# Patient Record
Sex: Male | Born: 1950
Health system: Southern US, Community
[De-identification: ages and names within clinical notes are randomized; demographics above are authoritative.]

## PROBLEM LIST (undated history)

## (undated) DIAGNOSIS — I2510 Atherosclerotic heart disease of native coronary artery without angina pectoris: Secondary | ICD-10-CM

## (undated) DIAGNOSIS — E669 Obesity, unspecified: Secondary | ICD-10-CM

## (undated) DIAGNOSIS — H33319 Horseshoe tear of retina without detachment, unspecified eye: Secondary | ICD-10-CM

## (undated) DIAGNOSIS — R7989 Other specified abnormal findings of blood chemistry: Secondary | ICD-10-CM

## (undated) DIAGNOSIS — I10 Essential (primary) hypertension: Secondary | ICD-10-CM

## (undated) DIAGNOSIS — R945 Abnormal results of liver function studies: Secondary | ICD-10-CM

## (undated) DIAGNOSIS — G4730 Sleep apnea, unspecified: Secondary | ICD-10-CM

## (undated) DIAGNOSIS — E785 Hyperlipidemia, unspecified: Secondary | ICD-10-CM

## (undated) DIAGNOSIS — F439 Reaction to severe stress, unspecified: Secondary | ICD-10-CM

## (undated) HISTORY — DX: Essential (primary) hypertension: I10

## (undated) HISTORY — PX: APPENDECTOMY: SHX54

## (undated) HISTORY — DX: Abnormal results of liver function studies: R94.5

## (undated) HISTORY — PX: TONSILLECTOMY: SUR1361

## (undated) HISTORY — DX: Hyperlipidemia, unspecified: E78.5

## (undated) HISTORY — DX: Atherosclerotic heart disease of native coronary artery without angina pectoris: I25.10

## (undated) HISTORY — DX: Sleep apnea, unspecified: G47.30

## (undated) HISTORY — DX: Other specified abnormal findings of blood chemistry: R79.89

## (undated) HISTORY — DX: Reaction to severe stress, unspecified: F43.9

## (undated) HISTORY — DX: Obesity, unspecified: E66.9

## (undated) HISTORY — PX: INGUINAL HERNIA REPAIR: SUR1180

## (undated) HISTORY — PX: VASECTOMY: SHX75

## (undated) HISTORY — DX: Horseshoe tear of retina without detachment, unspecified eye: H33.319

---

## 2000-05-02 ENCOUNTER — Ambulatory Visit (HOSPITAL_BASED_OUTPATIENT_CLINIC_OR_DEPARTMENT_OTHER): Admission: RE | Admit: 2000-05-02 | Discharge: 2000-05-02 | Payer: Self-pay | Admitting: Family Medicine

## 2001-07-11 ENCOUNTER — Ambulatory Visit (HOSPITAL_COMMUNITY): Admission: RE | Admit: 2001-07-11 | Discharge: 2001-07-11 | Payer: Self-pay | Admitting: Otolaryngology

## 2001-07-11 ENCOUNTER — Encounter: Payer: Self-pay | Admitting: Otolaryngology

## 2002-11-04 ENCOUNTER — Ambulatory Visit (HOSPITAL_BASED_OUTPATIENT_CLINIC_OR_DEPARTMENT_OTHER): Admission: RE | Admit: 2002-11-04 | Discharge: 2002-11-04 | Payer: Self-pay | Admitting: Pulmonary Disease

## 2004-08-15 HISTORY — PX: CARDIAC CATHETERIZATION: SHX172

## 2005-04-15 ENCOUNTER — Ambulatory Visit (HOSPITAL_COMMUNITY): Admission: RE | Admit: 2005-04-15 | Discharge: 2005-04-15 | Payer: Self-pay

## 2005-06-20 ENCOUNTER — Ambulatory Visit (HOSPITAL_COMMUNITY): Admission: RE | Admit: 2005-06-20 | Discharge: 2005-06-20 | Payer: Self-pay | Admitting: Chiropractic Medicine

## 2010-02-23 ENCOUNTER — Encounter: Admission: RE | Admit: 2010-02-23 | Discharge: 2010-02-23 | Payer: Self-pay | Admitting: Cardiology

## 2010-03-22 ENCOUNTER — Ambulatory Visit: Payer: Self-pay | Admitting: Cardiology

## 2010-10-04 ENCOUNTER — Ambulatory Visit (INDEPENDENT_AMBULATORY_CARE_PROVIDER_SITE_OTHER): Payer: BC Managed Care – PPO | Admitting: Cardiology

## 2010-10-04 DIAGNOSIS — E785 Hyperlipidemia, unspecified: Secondary | ICD-10-CM

## 2010-10-04 DIAGNOSIS — I10 Essential (primary) hypertension: Secondary | ICD-10-CM

## 2010-11-29 ENCOUNTER — Encounter: Payer: Self-pay | Admitting: Cardiology

## 2010-11-29 DIAGNOSIS — H33319 Horseshoe tear of retina without detachment, unspecified eye: Secondary | ICD-10-CM | POA: Insufficient documentation

## 2010-11-29 DIAGNOSIS — I2510 Atherosclerotic heart disease of native coronary artery without angina pectoris: Secondary | ICD-10-CM | POA: Insufficient documentation

## 2010-11-29 DIAGNOSIS — G4730 Sleep apnea, unspecified: Secondary | ICD-10-CM | POA: Insufficient documentation

## 2010-11-29 DIAGNOSIS — I10 Essential (primary) hypertension: Secondary | ICD-10-CM | POA: Insufficient documentation

## 2010-11-29 DIAGNOSIS — E669 Obesity, unspecified: Secondary | ICD-10-CM | POA: Insufficient documentation

## 2010-11-29 DIAGNOSIS — H409 Unspecified glaucoma: Secondary | ICD-10-CM | POA: Insufficient documentation

## 2010-11-29 DIAGNOSIS — E785 Hyperlipidemia, unspecified: Secondary | ICD-10-CM | POA: Insufficient documentation

## 2010-11-29 DIAGNOSIS — R7989 Other specified abnormal findings of blood chemistry: Secondary | ICD-10-CM | POA: Insufficient documentation

## 2010-11-29 DIAGNOSIS — F439 Reaction to severe stress, unspecified: Secondary | ICD-10-CM | POA: Insufficient documentation

## 2010-11-30 ENCOUNTER — Ambulatory Visit (INDEPENDENT_AMBULATORY_CARE_PROVIDER_SITE_OTHER): Payer: BC Managed Care – PPO | Admitting: Cardiology

## 2010-11-30 ENCOUNTER — Encounter: Payer: Self-pay | Admitting: Cardiology

## 2010-11-30 VITALS — BP 128/74 | HR 66 | Ht 70.5 in | Wt 230.0 lb

## 2010-11-30 DIAGNOSIS — I10 Essential (primary) hypertension: Secondary | ICD-10-CM

## 2010-11-30 DIAGNOSIS — Z13220 Encounter for screening for lipoid disorders: Secondary | ICD-10-CM

## 2010-11-30 DIAGNOSIS — E7800 Pure hypercholesterolemia, unspecified: Secondary | ICD-10-CM

## 2010-11-30 DIAGNOSIS — E785 Hyperlipidemia, unspecified: Secondary | ICD-10-CM

## 2010-11-30 DIAGNOSIS — I2510 Atherosclerotic heart disease of native coronary artery without angina pectoris: Secondary | ICD-10-CM

## 2010-11-30 LAB — BASIC METABOLIC PANEL
BUN: 20 mg/dL (ref 6–23)
CO2: 28 mEq/L (ref 19–32)
Calcium: 9.3 mg/dL (ref 8.4–10.5)
Chloride: 102 mEq/L (ref 96–112)
Creatinine, Ser: 0.8 mg/dL (ref 0.4–1.5)
Glucose, Bld: 100 mg/dL — ABNORMAL HIGH (ref 70–99)

## 2010-11-30 LAB — HEPATIC FUNCTION PANEL
ALT: 99 U/L — ABNORMAL HIGH (ref 0–53)
AST: 55 U/L — ABNORMAL HIGH (ref 0–37)
Bilirubin, Direct: 0.1 mg/dL (ref 0.0–0.3)
Total Bilirubin: 0.5 mg/dL (ref 0.3–1.2)

## 2010-11-30 LAB — LIPID PANEL: Total CHOL/HDL Ratio: 5

## 2010-11-30 NOTE — Progress Notes (Signed)
Subjective:   [name] comes in today for followup visit. He was started on amlodipine and has done well. Blood pressure has been well-controlled. He has no lower extremity edema. Lab work will be reviewed today. I encouraged him to lose weight and modify habits. He does have a good exercise habit on a rowing machine.  Current Outpatient Prescriptions  Medication Sig Dispense Refill  . amLODipine (NORVASC) 5 MG tablet Take 1 tablet by mouth daily.      Marland Kitchen aspirin 81 MG tablet Take 81 mg by mouth daily.        . fish oil-omega-3 fatty acids 1000 MG capsule Take by mouth daily.        Marland Kitchen FLUoxetine (PROZAC) 20 MG capsule Take 20 mg by mouth daily.        . metFORMIN (GLUCOPHAGE) 1000 MG tablet Take 1,000 mg by mouth 2 (two) times daily with a meal.        . Multiple Vitamin (MULTIVITAMIN) tablet Take 1 tablet by mouth daily.        . rosuvastatin (CRESTOR) 5 MG tablet Take 5 mg by mouth 3 (three) times a week.        . valsartan-hydrochlorothiazide (DIOVAN-HCT) 320-25 MG per tablet Take 1 tablet by mouth daily.          No Known Allergies  Patient Active Problem List  Diagnoses  . Coronary artery disease  . Obesity  . Stress  . Hyperlipidemia  . Hypertension  . Sleep apnea  . Glaucoma  . Tear of retina  . Diabetes mellitus  . LFT elevation    History  Smoking status  . Never Smoker   Smokeless tobacco  . Never Used    History  Alcohol Use No    Family History  Problem Relation Age of Onset  . Heart failure Father   . Alcohol abuse Brother     Review of Systems:   The patient denies any heat or cold intolerance.  No weight gain or weight loss.  The patient denies headaches or blurry vision.  There is no cough or sputum production.  The patient denies dizziness.  There is no hematuria or hematochezia.  The patient denies any muscle aches or arthritis.  The patient denies any rash.  The patient denies frequent falling or instability.  There is no history of depression or anxiety.   All other systems were reviewed and are negative.   Physical Exam:   Vital signs are reviewed.The head is normocephalic and atraumatic.  Pupils are equally round and reactive to light.  Sclerae nonicteric.  Conjunctiva is clear.  Oropharynx is unremarkable.  There's adequate oral airway.  Neck is supple there are no masses.  Thyroid is not enlarged.  There is no lymphadenopathy.  Lungs are clear.  Chest is symmetric.  Heart shows a regular rate and rhythm.  S1 and S2 are normal.  There is no murmur click or gallop.  Abdomen is soft normal bowel sounds.  There is no organomegaly.  Genital and rectal deferred.  Extremities are without edema.  Peripheral pulses are adequate.  Neurologically intact.  Full range of motion.  The patient is not depressed.  Skin is warm and dry.  Assessment / Plan:

## 2010-11-30 NOTE — Assessment & Plan Note (Signed)
Blood pressure is well-controlled with the addition of amlodipine 5 mg per day.  Prescriptions are written.

## 2010-11-30 NOTE — Assessment & Plan Note (Signed)
He has mild coronary atherosclerosis which currently is asymptomatic. I will have him see Dr. Jens Som in one year to continue management review of his hypertension, hyperlipemia, and multiple cardiovascular risk factors. He did have cardiac catheterization which showed mild coronary atherosclerosis. He has somewhat diffuse 20-30% lesions throughout his coronary tree.

## 2010-11-30 NOTE — Assessment & Plan Note (Signed)
We'll continue Crestor 5 mg 3 times a week as well as his omega-3 fatty acids. He does have a problem with mild elevation of LFTs and lipids will be checked. He has a documented fatty liver and I encouraged him to lose weight again.

## 2010-12-02 ENCOUNTER — Telehealth: Payer: Self-pay | Admitting: *Deleted

## 2010-12-02 NOTE — Telephone Encounter (Signed)
Pt notified of lab results and was advised to loss weight, exercise regularly, and watch diet.  Pt told to have labs again in six months.

## 2010-12-02 NOTE — Telephone Encounter (Signed)
Message copied by Barnetta Hammersmith on Thu Dec 02, 2010  8:16 AM ------      Message from: Roger Shelter      Created: Wed Dec 01, 2010  1:58 PM       Work on weight loss, diet, and exercise.

## 2010-12-31 NOTE — H&P (Signed)
NAME:  JAHRED, TATAR               ACCOUNT NO.:  [id number]   MEDICAL RECORD NO.:  [id number]          PATIENT TYPE:  OIB   LOCATION:                               FACILITY:  [facility]   PHYSICIAN:  Colleen Can. Deborah Chalk, M.D.DATE OF BIRTH:  04/29/1951   DATE OF ADMISSION:  04/15/2005  DATE OF DISCHARGE:                                HISTORY & PHYSICAL   CHIEF COMPLAINT:  Previous episode of chest pain.   HISTORY OF PRESENT ILLNESS:  Mr. Voiles is a very pleasant 60 year old white  male.  He has had a longstanding history of hypertension, as well as sleep  apnea.  He presented to our office for a consultation on April 08, 2005.  He said that on the Saturday prior to being seen, he was mowing his yard in  the extreme heat and became exhausted and felt a significant loss of energy.  While it was definitely hot that day, it certainly was a very atypical  symptom for him to experience.  He also had a pulling-like sensation in the  left lower chest.  He was subsequently fatigued and wiped out the next day.  He went to an emergency [facility] where he had an electrocardiogram  performed, which was unremarkable.  He has been referred for a stress  Cardiolite study which was performed on April 11, 2005.  He exercised on  the standard Bruce protocol for a total of 12 minutes.  He demonstrated  excellent exercise tolerance with an adequate blood pressure response.  Clinically he had no complaints of chest pain.  His electrocardiogram was  negative; however, he did have some equivocal ST up-sloping.  His Cardiolite  images showed inferior basal ischemia.  His ejection fraction was 59%.  He  is now referred for an elective cardiac catheterization.  He has had no  recurrent episodes.   PAST MEDICAL HISTORY:  1.  Longstanding hypertension.  2.  Hyperlipidemia.  3.  History of sleep apnea.  4.  Glaucoma.  5.  Previous retinal tear.   ALLERGIES:  PENICILLIN AND QUESTIONABLY XYLOCAINE.   CURRENT MEDICATIONS:  1.  Trusopt eye drops.  2.  Xalatan eye drops.  3.  Diovan/hydrochlorothiazide 160/12.5 mg daily.  4.  Baby aspirin daily.   FAMILY HISTORY:  Father died at age 58 with heart failure.  Mother died at  age 16 with scleroderma.   SOCIAL HISTORY:  He works as a Production designer, theatre/television/film for [employer], broadcasting for [employer].  He has no tobacco use and only engages in social alcohol use.  He lives at home with his wife and daughter.   REVIEW OF SYSTEMS:  Basically as noted above and is otherwise unremarkable.   PHYSICAL EXAMINATION:  GENERAL:  He is a pleasant middle-aged male, in no  acute distress.  VITAL SIGNS:  Blood pressure 140/80 sitting and standing.  Weight 217  pounds.  Heart rate 76 and regular, respirations 18.  He is afebrile.  SKIN:  Warm and dry.  Color is unremarkable.  LUNGS:  Basically clear.  HEART:  A regular rhythm.  ABDOMEN:  Soft, positive  bowel sounds, nontender.  EXTREMITIES:  Without edema.  NEUROLOGIC:  Intact.  There are no gross focal deficits.   LABORATORY DATA:  TSH normal at 2.063.  Chemistries are normal.  BUN 20,  creatinine 1.1.  Cholesterol level showed a total cholesterol of 227,  triglycerides 289, HDL 32, LDL 138.  CBC is normal.  PT and PTT  unremarkable.   Electrocardiogram shows a sinus rhythm.   OVERALL IMPRESSION:  1.  Previous episode of chest pain in the setting of extreme fatigue.  2.  Abnormal stress Cardiolite.  3.  Longstanding hypertension.  4.  Hyperlipidemia, currently untreated.  5.  History of sleep apnea.   PLAN:  Will proceed on with an elective cardiac catheterization.  The  procedure has been reviewed in full detail, including the risks and  benefits, and he is willing to proceed on Friday, April 15, 2005.      Sharlee Blew, N.P.      Colleen Can. Deborah Chalk, M.D.  Electronically Signed    LC/MEDQ  D:  04/13/2005  T:  04/13/2005  Job:  [id number]

## 2010-12-31 NOTE — Cardiovascular Report (Signed)
[name], [name]               ACCOUNT NO.:  [id number]   MEDICAL RECORD NO.:  [id number]          PATIENT TYPE:  OIB   LOCATION:  2899                         FACILITY:  [facility]   PHYSICIAN:  Colleen Can. Deborah Chalk, M.D.DATE OF BIRTH:  04-06-1951   DATE OF PROCEDURE:  04/15/2005  DATE OF DISCHARGE:                              CARDIAC CATHETERIZATION   PROCEDURE:  Left heart catheterization with selective coronary angiography  and left ventricular angiography.   TYPE/SITE OF ENTRY:  Percutaneous right femoral artery with Angio-Seal.   CATHETERS:  6-French __________ curved Judkins right and left coronary  catheter; 6-French pigtail ventricular catheter.   CONTRAST MATERIAL:  Omnipaque.   MEDICATIONS GIVEN PRIOR TO PROCEDURE:  Valium 10 mg p.o.   MEDICATIONS GIVEN DURING PROCEDURE:  Versed 7 mg IV.   MEDICATIONS GIVEN AFTER PROCEDURE:  Vancomycin 1 gm IV.   HEMODYNAMIC DATA:  The aortic pressure was 121/67, LV 121/-11. There was no  aortic valve gradient noted on pullback.   ANGIOGRAPHIC DATA:  1.  The left main coronary artery is normal.  2.  The left circumflex continued as a large obtuse marginal. There was 20%      to 30% narrowing in the obtuse marginal, but no obstructive coronary      atherosclerosis.  3.  The left anterior descending bifurcated essentially in the mid portion.      The left anterior descending proper ended prior to the apex. The large      diagonal vessel had a 20% to 30% narrowing in its origin. There are      scattered irregularities, otherwise, that are nonobstructive.  4.  The right coronary artery is a large dominant vessel. There are      irregularities throughout the course of the vessel, but no obstructive      disease is noted.   Left ventriculogram is performed in the RAO position. Overall cardiac size  is normal. The global ejection fraction is 60% and regional wall motion is  normal.   OVERALL IMPRESSION:  1.  Normal left ventricular  function.  2.  Mild nonobstructive three-vessel coronary atherosclerosis.   PLAN:  I will manage blood pressure, lipids, and encourage diet, exercise,  and other modification cardiovascular risk factors.      Colleen Can. Deborah Chalk, M.D.  Electronically Signed     SNT/MEDQ  D:  04/15/2005  T:  04/15/2005  Job:  [id number]

## 2011-09-05 ENCOUNTER — Other Ambulatory Visit: Payer: Self-pay

## 2011-09-05 MED ORDER — AMLODIPINE BESYLATE 5 MG PO TABS: 5.0000 mg | ORAL_TABLET | Freq: Every day | ORAL | Status: AC

## 2011-09-05 NOTE — Telephone Encounter (Signed)
..   Requested Prescriptions   Signed Prescriptions Disp Refills  . amLODipine (NORVASC) 5 MG tablet 90 tablet 3    Sig: Take 1 tablet (5 mg total) by mouth daily.    Authorizing Provider: Lewayne Bunting    Ordering User: Christella Hartigan, Rama Sorci Judie Petit

## 2012-01-10 ENCOUNTER — Ambulatory Visit (INDEPENDENT_AMBULATORY_CARE_PROVIDER_SITE_OTHER): Payer: BC Managed Care – PPO | Admitting: Cardiology

## 2012-01-10 ENCOUNTER — Encounter: Payer: Self-pay | Admitting: Cardiology

## 2012-01-10 VITALS — BP 118/70 | HR 61 | Ht 70.0 in | Wt 218.0 lb

## 2012-01-10 DIAGNOSIS — I10 Essential (primary) hypertension: Secondary | ICD-10-CM

## 2012-01-10 DIAGNOSIS — E785 Hyperlipidemia, unspecified: Secondary | ICD-10-CM

## 2012-01-10 DIAGNOSIS — I2510 Atherosclerotic heart disease of native coronary artery without angina pectoris: Secondary | ICD-10-CM

## 2012-01-10 NOTE — Assessment & Plan Note (Signed)
Blood pressure controlled. Continue present medications. Potassium and renal function monitored by primary care. 

## 2012-01-10 NOTE — Assessment & Plan Note (Signed)
Continue aspirin and statin. Continue risk factor modification. 

## 2012-01-10 NOTE — Progress Notes (Signed)
   HPI: 61 year old male previously followed by Dr. Deborah Chalk for followup of coronary artery disease. Cardiac catheterization in September of 2006 showed a normal left main, a 20-30% OM, a 20-30% large diagonal and no other obstructive disease. Ejection fraction was 60%. Echocardiogram in September of 2006 revealed normal LV function and mild left ventricular hypertrophy. Nuclear study in February of 2011 revealed normal perfusion and an ejection fraction of 63%. Patient was last seen in April of 2012. Since then, the patient denies any dyspnea on exertion, orthopnea, PND, pedal edema, palpitations, syncope or chest pain.   Current Outpatient Prescriptions  Medication Sig Dispense Refill  . amLODipine (NORVASC) 5 MG tablet Take 1 tablet (5 mg total) by mouth daily.  90 tablet  3  . aspirin 81 MG tablet Take 81 mg by mouth daily.        . fish oil-omega-3 fatty acids 1000 MG capsule Take by mouth daily.        Marland Kitchen FLUoxetine (PROZAC) 20 MG capsule Take 20 mg by mouth daily.        . metFORMIN (GLUCOPHAGE) 1000 MG tablet Take 1,000 mg by mouth 2 (two) times daily with a meal.        . Multiple Vitamin (MULTIVITAMIN) tablet Take 1 tablet by mouth daily.        . rosuvastatin (CRESTOR) 5 MG tablet Take 5 mg by mouth 3 (three) times a week.        . valsartan-hydrochlorothiazide (DIOVAN-HCT) 320-25 MG per tablet Take 1 tablet by mouth daily.           Past Medical History  Diagnosis Date  . Coronary artery disease     MILD  . Obesity   . Stress   . Hyperlipidemia   . Hypertension   . Sleep apnea   . Glaucoma   . Tear of retina   . Diabetes mellitus   . LFT elevation     Past Surgical History  Procedure Date  . Cardiac catheterization 2006  . Appendectomy   . Tonsillectomy   . Vasectomy   . Inguinal hernia repair     RIGHT    History   Social History  . Marital Status: Single    Spouse Name: N/A    Number of Children: N/A  . Years of Education: N/A   Occupational History  .  Not on file.   Social History Main Topics  . Smoking status: Never Smoker   . Smokeless tobacco: Never Used  . Alcohol Use: No  . Drug Use: No  . Sexually Active: Not on file   Other Topics Concern  . Not on file   Social History Narrative  . No narrative on file    ROS: no fevers or chills, productive cough, hemoptysis, dysphasia, odynophagia, melena, hematochezia, dysuria, hematuria, rash, seizure activity, orthopnea, PND, pedal edema, claudication. Remaining systems are negative.  Physical Exam: Well-developed well-nourished in no acute distress.  Skin is warm and dry. Poison ivy over arms HEENT is normal.  Neck is supple.  Chest is clear to auscultation with normal expansion.  Cardiovascular exam is regular rate and rhythm.  Abdominal exam nontender or distended. No masses palpated. Extremities show no edema. neuro grossly intact  ECG sinus rhythm at a rate of 61. No ST changes.

## 2012-01-10 NOTE — Assessment & Plan Note (Addendum)
Continue statin. Lipids and liver monitored by primary care. 

## 2012-01-10 NOTE — Patient Instructions (Signed)
Your physician wants you to follow-up in: ONE YEAR WITH DR CRENSHAW You will receive a reminder letter in the mail two months in advance. If you don't receive a letter, please call our office to schedule the follow-up appointment.  

## 2012-02-13 ENCOUNTER — Other Ambulatory Visit: Payer: Self-pay | Admitting: Family Medicine

## 2012-02-13 DIAGNOSIS — R7989 Other specified abnormal findings of blood chemistry: Secondary | ICD-10-CM

## 2012-03-02 ENCOUNTER — Ambulatory Visit
Admission: RE | Admit: 2012-03-02 | Discharge: 2012-03-02 | Disposition: A | Discharge status: Home / Self Care | Payer: BC Managed Care – PPO | Source: Ambulatory Visit | Attending: Family Medicine | Admitting: Family Medicine

## 2012-03-02 DIAGNOSIS — R7989 Other specified abnormal findings of blood chemistry: Secondary | ICD-10-CM

## 2013-01-25 ENCOUNTER — Encounter: Payer: Self-pay | Admitting: Cardiology

## 2017-08-21 ENCOUNTER — Ambulatory Visit (INDEPENDENT_AMBULATORY_CARE_PROVIDER_SITE_OTHER): Payer: BLUE CROSS/BLUE SHIELD | Admitting: Otolaryngology

## 2017-08-21 DIAGNOSIS — H95122 Granulation of postmastoidectomy cavity, left ear: Secondary | ICD-10-CM

## 2017-09-11 ENCOUNTER — Telehealth: Payer: Self-pay

## 2017-09-11 NOTE — Telephone Encounter (Signed)
Referral sent to scheduling. 

## 2017-09-28 ENCOUNTER — Other Ambulatory Visit: Payer: Self-pay | Admitting: Family Medicine

## 2017-09-28 DIAGNOSIS — R4689 Other symptoms and signs involving appearance and behavior: Secondary | ICD-10-CM

## 2017-10-06 ENCOUNTER — Ambulatory Visit
Admission: RE | Admit: 2017-10-06 | Discharge: 2017-10-06 | Disposition: A | Discharge status: Home / Self Care | Payer: BLUE CROSS/BLUE SHIELD | Source: Ambulatory Visit | Attending: Family Medicine | Admitting: Family Medicine

## 2017-10-06 DIAGNOSIS — R4689 Other symptoms and signs involving appearance and behavior: Secondary | ICD-10-CM

## 2017-10-06 MED ORDER — GADOBENATE DIMEGLUMINE 529 MG/ML IV SOLN
20.0000 mL | Freq: Once | INTRAVENOUS | Status: AC | PRN
  Administered 2017-10-06: 20 mL via INTRAVENOUS

## 2017-10-10 ENCOUNTER — Other Ambulatory Visit: Payer: BLUE CROSS/BLUE SHIELD

## 2018-02-07 ENCOUNTER — Ambulatory Visit (INDEPENDENT_AMBULATORY_CARE_PROVIDER_SITE_OTHER): Payer: BLUE CROSS/BLUE SHIELD | Admitting: Podiatry

## 2018-02-07 ENCOUNTER — Encounter: Payer: Self-pay | Admitting: Podiatry

## 2018-02-07 VITALS — BP 132/76 | HR 71

## 2018-02-07 DIAGNOSIS — L600 Ingrowing nail: Secondary | ICD-10-CM

## 2018-02-07 NOTE — Patient Instructions (Signed)

## 2018-02-08 NOTE — Progress Notes (Signed)
Subjective:   Patient ID: [name], male   DOB: 67 y.o.   MRN: [id number]   HPI Patient presents stating my left big toenail has been very tender and is been going on for years and I tried to soak it in treatment without relief and I am not noted any drainage but it is sore with palpation.  Patient does not smoke and likes to be active   Review of Systems  All other systems reviewed and are negative.       Objective:  Physical Exam  Constitutional: He appears well-developed and well-nourished.  Cardiovascular: Intact distal pulses.  Pulmonary/Chest: Effort normal.  Musculoskeletal: Normal range of motion.  Neurological: He is alert.  Skin: Skin is warm.  Nursing note and vitals reviewed.   Neurovascular status intact muscle strength adequate range of motion within normal limits with patient found to have incurvated left hallux lateral border that is very tender when pressed with irritation but no active drainage or redness.  Patient's diabetes under current good control and patient has good digital perfusion and is well oriented x3     Assessment:  Chronic ingrown toenail of the left hallux lateral border     Plan:  H&P condition reviewed and recommended removal of the nail border.  I explained procedure and risk and today I infiltrated the right hallux 60 mg Xylocaine Marcaine mixture did sterile prep of the toe using sterile instrumentation remove the lateral border exposed matrix and applied phenol 3 applications 30 seconds followed by alcohol lavage and sterile dressing.  Gave instructions on soaks and reappoint

## 2018-02-27 DIAGNOSIS — H66002 Acute suppurative otitis media without spontaneous rupture of ear drum, left ear: Secondary | ICD-10-CM | POA: Insufficient documentation

## 2018-02-27 DIAGNOSIS — H7092 Unspecified mastoiditis, left ear: Secondary | ICD-10-CM | POA: Insufficient documentation

## 2018-03-05 ENCOUNTER — Ambulatory Visit: Payer: Self-pay | Admitting: Podiatry

## 2018-03-14 DIAGNOSIS — H906 Mixed conductive and sensorineural hearing loss, bilateral: Secondary | ICD-10-CM | POA: Insufficient documentation

## 2018-04-25 DIAGNOSIS — H7092 Unspecified mastoiditis, left ear: Secondary | ICD-10-CM | POA: Diagnosis not present

## 2018-04-25 DIAGNOSIS — H6983 Other specified disorders of Eustachian tube, bilateral: Secondary | ICD-10-CM | POA: Insufficient documentation

## 2018-04-30 DIAGNOSIS — Z0000 Encounter for general adult medical examination without abnormal findings: Secondary | ICD-10-CM | POA: Diagnosis not present

## 2018-04-30 DIAGNOSIS — M1A0790 Idiopathic chronic gout, unspecified ankle and foot, without tophus (tophi): Secondary | ICD-10-CM | POA: Diagnosis not present

## 2018-04-30 DIAGNOSIS — F331 Major depressive disorder, recurrent, moderate: Secondary | ICD-10-CM | POA: Diagnosis not present

## 2018-04-30 DIAGNOSIS — Z125 Encounter for screening for malignant neoplasm of prostate: Secondary | ICD-10-CM | POA: Diagnosis not present

## 2018-04-30 DIAGNOSIS — K7581 Nonalcoholic steatohepatitis (NASH): Secondary | ICD-10-CM | POA: Diagnosis not present

## 2018-04-30 DIAGNOSIS — E7800 Pure hypercholesterolemia, unspecified: Secondary | ICD-10-CM | POA: Diagnosis not present

## 2018-04-30 DIAGNOSIS — E119 Type 2 diabetes mellitus without complications: Secondary | ICD-10-CM | POA: Diagnosis not present

## 2018-04-30 DIAGNOSIS — Z23 Encounter for immunization: Secondary | ICD-10-CM | POA: Diagnosis not present

## 2018-04-30 DIAGNOSIS — I10 Essential (primary) hypertension: Secondary | ICD-10-CM | POA: Diagnosis not present

## 2018-06-21 DIAGNOSIS — H401131 Primary open-angle glaucoma, bilateral, mild stage: Secondary | ICD-10-CM | POA: Diagnosis not present

## 2018-07-02 ENCOUNTER — Other Ambulatory Visit: Payer: Self-pay | Admitting: Otolaryngology

## 2018-07-02 DIAGNOSIS — H7092 Unspecified mastoiditis, left ear: Secondary | ICD-10-CM

## 2018-07-06 ENCOUNTER — Ambulatory Visit
Admission: RE | Admit: 2018-07-06 | Discharge: 2018-07-06 | Disposition: A | Discharge status: Home / Self Care | Payer: Medicare Other | Source: Ambulatory Visit | Attending: Otolaryngology | Admitting: Otolaryngology

## 2018-07-06 DIAGNOSIS — H7092 Unspecified mastoiditis, left ear: Secondary | ICD-10-CM

## 2018-07-06 DIAGNOSIS — H9202 Otalgia, left ear: Secondary | ICD-10-CM | POA: Diagnosis not present

## 2018-07-18 DIAGNOSIS — H7092 Unspecified mastoiditis, left ear: Secondary | ICD-10-CM | POA: Diagnosis not present

## 2018-07-23 DIAGNOSIS — H90A21 Sensorineural hearing loss, unilateral, right ear, with restricted hearing on the contralateral side: Secondary | ICD-10-CM | POA: Diagnosis not present

## 2018-07-23 DIAGNOSIS — H90A32 Mixed conductive and sensorineural hearing loss, unilateral, left ear with restricted hearing on the contralateral side: Secondary | ICD-10-CM | POA: Diagnosis not present

## 2018-08-02 DIAGNOSIS — R197 Diarrhea, unspecified: Secondary | ICD-10-CM | POA: Diagnosis not present

## 2018-08-07 DIAGNOSIS — R197 Diarrhea, unspecified: Secondary | ICD-10-CM | POA: Diagnosis not present

## 2018-08-10 DIAGNOSIS — H7012 Chronic mastoiditis, left ear: Secondary | ICD-10-CM | POA: Diagnosis not present

## 2018-08-10 DIAGNOSIS — H7192 Unspecified cholesteatoma, left ear: Secondary | ICD-10-CM | POA: Diagnosis not present

## 2018-08-10 DIAGNOSIS — H7092 Unspecified mastoiditis, left ear: Secondary | ICD-10-CM | POA: Diagnosis not present

## 2018-09-07 DIAGNOSIS — E1169 Type 2 diabetes mellitus with other specified complication: Secondary | ICD-10-CM | POA: Diagnosis not present

## 2018-09-07 DIAGNOSIS — G4733 Obstructive sleep apnea (adult) (pediatric): Secondary | ICD-10-CM | POA: Diagnosis not present

## 2018-09-17 DIAGNOSIS — R066 Hiccough: Secondary | ICD-10-CM | POA: Diagnosis not present

## 2018-09-17 DIAGNOSIS — R140 Abdominal distension (gaseous): Secondary | ICD-10-CM | POA: Diagnosis not present

## 2018-09-25 DIAGNOSIS — L820 Inflamed seborrheic keratosis: Secondary | ICD-10-CM | POA: Diagnosis not present

## 2018-09-25 DIAGNOSIS — D1801 Hemangioma of skin and subcutaneous tissue: Secondary | ICD-10-CM | POA: Diagnosis not present

## 2018-09-25 DIAGNOSIS — D229 Melanocytic nevi, unspecified: Secondary | ICD-10-CM | POA: Diagnosis not present

## 2018-09-25 DIAGNOSIS — L821 Other seborrheic keratosis: Secondary | ICD-10-CM | POA: Diagnosis not present

## 2018-09-25 DIAGNOSIS — L814 Other melanin hyperpigmentation: Secondary | ICD-10-CM | POA: Diagnosis not present

## 2018-09-25 DIAGNOSIS — L738 Other specified follicular disorders: Secondary | ICD-10-CM | POA: Diagnosis not present

## 2018-10-05 DIAGNOSIS — M7989 Other specified soft tissue disorders: Secondary | ICD-10-CM | POA: Diagnosis not present

## 2018-10-05 DIAGNOSIS — Z7984 Long term (current) use of oral hypoglycemic drugs: Secondary | ICD-10-CM | POA: Diagnosis not present

## 2018-10-05 DIAGNOSIS — E119 Type 2 diabetes mellitus without complications: Secondary | ICD-10-CM | POA: Diagnosis not present

## 2018-10-19 DIAGNOSIS — E119 Type 2 diabetes mellitus without complications: Secondary | ICD-10-CM | POA: Diagnosis not present

## 2018-10-19 DIAGNOSIS — H2513 Age-related nuclear cataract, bilateral: Secondary | ICD-10-CM | POA: Diagnosis not present

## 2018-10-19 DIAGNOSIS — H401131 Primary open-angle glaucoma, bilateral, mild stage: Secondary | ICD-10-CM | POA: Diagnosis not present

## 2018-10-19 DIAGNOSIS — H5213 Myopia, bilateral: Secondary | ICD-10-CM | POA: Diagnosis not present

## 2018-12-10 DIAGNOSIS — L089 Local infection of the skin and subcutaneous tissue, unspecified: Secondary | ICD-10-CM | POA: Diagnosis not present

## 2018-12-10 DIAGNOSIS — L309 Dermatitis, unspecified: Secondary | ICD-10-CM | POA: Diagnosis not present

## 2019-01-01 DIAGNOSIS — H7092 Unspecified mastoiditis, left ear: Secondary | ICD-10-CM | POA: Diagnosis not present

## 2019-01-21 DIAGNOSIS — H66002 Acute suppurative otitis media without spontaneous rupture of ear drum, left ear: Secondary | ICD-10-CM | POA: Diagnosis not present

## 2019-01-30 ENCOUNTER — Encounter: Payer: Self-pay | Admitting: Podiatry

## 2019-01-30 ENCOUNTER — Other Ambulatory Visit: Payer: Self-pay

## 2019-01-30 ENCOUNTER — Ambulatory Visit (INDEPENDENT_AMBULATORY_CARE_PROVIDER_SITE_OTHER): Payer: Medicare Other | Admitting: Podiatry

## 2019-01-30 VITALS — Temp 97.9°F

## 2019-01-30 DIAGNOSIS — L600 Ingrowing nail: Secondary | ICD-10-CM | POA: Diagnosis not present

## 2019-01-30 DIAGNOSIS — B351 Tinea unguium: Secondary | ICD-10-CM | POA: Diagnosis not present

## 2019-01-30 DIAGNOSIS — M79674 Pain in right toe(s): Secondary | ICD-10-CM

## 2019-01-30 DIAGNOSIS — M79675 Pain in left toe(s): Secondary | ICD-10-CM | POA: Diagnosis not present

## 2019-01-30 MED ORDER — NEOMYCIN-POLYMYXIN-HC 3.5-10000-1 OT SOLN: OTIC | 0 refills | Status: DC

## 2019-01-30 NOTE — Patient Instructions (Signed)

## 2019-02-06 DIAGNOSIS — H7092 Unspecified mastoiditis, left ear: Secondary | ICD-10-CM | POA: Diagnosis not present

## 2019-02-06 DIAGNOSIS — H66002 Acute suppurative otitis media without spontaneous rupture of ear drum, left ear: Secondary | ICD-10-CM | POA: Diagnosis not present

## 2019-02-06 NOTE — Progress Notes (Signed)
Subjective:   Patient ID: [name], male   DOB: 68 y.o.   MRN: [id number]   HPI Patient presents stating has a chronic ingrown toenail of his left big toe that has returned there is no current drainage noted associated with it but it is painful.  Patient has other nails removed which is done well and states this 1 has been bothersome   ROS      Objective:  Physical Exam  Neurovascular status intact with patient found to have incurvated left hallux lateral border that is painful when pressed with no active drainage or redness noted.  It is painful with palpation     Assessment:  Ingrown toenail deformity left hallux lateral border with pain     Plan:  H&P discussed condition recommended correction.  Explained procedure risk and patient read and then signed consent form.  I infiltrated the left hallux 60 mg like Marcaine mixture removed border exposed matrix and applied phenol 3 applications 30 seconds followed by alcohol lavage and sterile dressing.  Gave instructions on soaks and bandage usage and reappoint to recheck and instructed on drop usage also

## 2019-02-09 ENCOUNTER — Other Ambulatory Visit: Payer: Self-pay | Admitting: Otolaryngology

## 2019-02-11 DIAGNOSIS — H7092 Unspecified mastoiditis, left ear: Secondary | ICD-10-CM | POA: Diagnosis not present

## 2019-02-11 DIAGNOSIS — H6012 Cellulitis of left external ear: Secondary | ICD-10-CM | POA: Diagnosis not present

## 2019-02-11 DIAGNOSIS — Z9622 Myringotomy tube(s) status: Secondary | ICD-10-CM | POA: Diagnosis not present

## 2019-02-11 DIAGNOSIS — H66002 Acute suppurative otitis media without spontaneous rupture of ear drum, left ear: Secondary | ICD-10-CM | POA: Diagnosis not present

## 2019-02-12 DIAGNOSIS — H6012 Cellulitis of left external ear: Secondary | ICD-10-CM | POA: Diagnosis not present

## 2019-02-13 DIAGNOSIS — T7840XD Allergy, unspecified, subsequent encounter: Secondary | ICD-10-CM | POA: Diagnosis not present

## 2019-02-13 DIAGNOSIS — H6012 Cellulitis of left external ear: Secondary | ICD-10-CM | POA: Diagnosis not present

## 2019-02-13 DIAGNOSIS — L270 Generalized skin eruption due to drugs and medicaments taken internally: Secondary | ICD-10-CM | POA: Diagnosis not present

## 2019-02-14 DIAGNOSIS — H6012 Cellulitis of left external ear: Secondary | ICD-10-CM | POA: Diagnosis not present

## 2019-02-19 DIAGNOSIS — H6012 Cellulitis of left external ear: Secondary | ICD-10-CM | POA: Diagnosis not present

## 2019-02-19 DIAGNOSIS — T7840XD Allergy, unspecified, subsequent encounter: Secondary | ICD-10-CM | POA: Diagnosis not present

## 2019-02-19 DIAGNOSIS — L270 Generalized skin eruption due to drugs and medicaments taken internally: Secondary | ICD-10-CM | POA: Diagnosis not present

## 2019-03-04 DIAGNOSIS — T7840XD Allergy, unspecified, subsequent encounter: Secondary | ICD-10-CM | POA: Diagnosis not present

## 2019-03-04 DIAGNOSIS — H6012 Cellulitis of left external ear: Secondary | ICD-10-CM | POA: Diagnosis not present

## 2019-03-04 DIAGNOSIS — L270 Generalized skin eruption due to drugs and medicaments taken internally: Secondary | ICD-10-CM | POA: Diagnosis not present

## 2019-03-09 DIAGNOSIS — Z20828 Contact with and (suspected) exposure to other viral communicable diseases: Secondary | ICD-10-CM | POA: Diagnosis not present

## 2019-03-12 DIAGNOSIS — I10 Essential (primary) hypertension: Secondary | ICD-10-CM | POA: Diagnosis not present

## 2019-03-12 DIAGNOSIS — E7800 Pure hypercholesterolemia, unspecified: Secondary | ICD-10-CM | POA: Diagnosis not present

## 2019-03-12 DIAGNOSIS — E119 Type 2 diabetes mellitus without complications: Secondary | ICD-10-CM | POA: Diagnosis not present

## 2019-03-12 DIAGNOSIS — Z7984 Long term (current) use of oral hypoglycemic drugs: Secondary | ICD-10-CM | POA: Diagnosis not present

## 2019-03-26 DIAGNOSIS — H401132 Primary open-angle glaucoma, bilateral, moderate stage: Secondary | ICD-10-CM | POA: Diagnosis not present

## 2019-03-30 DIAGNOSIS — L03032 Cellulitis of left toe: Secondary | ICD-10-CM | POA: Diagnosis not present

## 2019-04-01 DIAGNOSIS — E119 Type 2 diabetes mellitus without complications: Secondary | ICD-10-CM | POA: Diagnosis not present

## 2019-04-01 DIAGNOSIS — I10 Essential (primary) hypertension: Secondary | ICD-10-CM | POA: Diagnosis not present

## 2019-04-01 DIAGNOSIS — Z7984 Long term (current) use of oral hypoglycemic drugs: Secondary | ICD-10-CM | POA: Diagnosis not present

## 2019-04-05 DIAGNOSIS — E119 Type 2 diabetes mellitus without complications: Secondary | ICD-10-CM | POA: Diagnosis not present

## 2019-04-05 DIAGNOSIS — Z7984 Long term (current) use of oral hypoglycemic drugs: Secondary | ICD-10-CM | POA: Diagnosis not present

## 2019-04-05 DIAGNOSIS — M2540 Effusion, unspecified joint: Secondary | ICD-10-CM | POA: Diagnosis not present

## 2019-04-08 DIAGNOSIS — H6690 Otitis media, unspecified, unspecified ear: Secondary | ICD-10-CM | POA: Diagnosis not present

## 2019-04-17 DIAGNOSIS — M1A0790 Idiopathic chronic gout, unspecified ankle and foot, without tophus (tophi): Secondary | ICD-10-CM | POA: Diagnosis not present

## 2019-04-17 DIAGNOSIS — I10 Essential (primary) hypertension: Secondary | ICD-10-CM | POA: Diagnosis not present

## 2019-04-17 DIAGNOSIS — Z7984 Long term (current) use of oral hypoglycemic drugs: Secondary | ICD-10-CM | POA: Diagnosis not present

## 2019-04-17 DIAGNOSIS — E119 Type 2 diabetes mellitus without complications: Secondary | ICD-10-CM | POA: Diagnosis not present

## 2019-05-01 ENCOUNTER — Ambulatory Visit: Payer: Medicare Other | Admitting: Podiatry

## 2019-05-08 DIAGNOSIS — Z0001 Encounter for general adult medical examination with abnormal findings: Secondary | ICD-10-CM | POA: Diagnosis not present

## 2019-05-08 DIAGNOSIS — G4700 Insomnia, unspecified: Secondary | ICD-10-CM | POA: Diagnosis not present

## 2019-05-08 DIAGNOSIS — I10 Essential (primary) hypertension: Secondary | ICD-10-CM | POA: Diagnosis not present

## 2019-05-08 DIAGNOSIS — Z0000 Encounter for general adult medical examination without abnormal findings: Secondary | ICD-10-CM | POA: Diagnosis not present

## 2019-05-08 DIAGNOSIS — E119 Type 2 diabetes mellitus without complications: Secondary | ICD-10-CM | POA: Diagnosis not present

## 2019-05-08 DIAGNOSIS — E7800 Pure hypercholesterolemia, unspecified: Secondary | ICD-10-CM | POA: Diagnosis not present

## 2019-05-08 DIAGNOSIS — K7581 Nonalcoholic steatohepatitis (NASH): Secondary | ICD-10-CM | POA: Diagnosis not present

## 2019-05-08 DIAGNOSIS — Z79899 Other long term (current) drug therapy: Secondary | ICD-10-CM | POA: Diagnosis not present

## 2019-05-08 DIAGNOSIS — Z7984 Long term (current) use of oral hypoglycemic drugs: Secondary | ICD-10-CM | POA: Diagnosis not present

## 2019-05-08 DIAGNOSIS — Z125 Encounter for screening for malignant neoplasm of prostate: Secondary | ICD-10-CM | POA: Diagnosis not present

## 2019-05-15 DIAGNOSIS — H66002 Acute suppurative otitis media without spontaneous rupture of ear drum, left ear: Secondary | ICD-10-CM | POA: Diagnosis not present

## 2019-05-15 DIAGNOSIS — T7840XD Allergy, unspecified, subsequent encounter: Secondary | ICD-10-CM | POA: Diagnosis not present

## 2019-05-15 DIAGNOSIS — L270 Generalized skin eruption due to drugs and medicaments taken internally: Secondary | ICD-10-CM | POA: Diagnosis not present

## 2019-07-03 ENCOUNTER — Ambulatory Visit: Payer: Medicare Other | Admitting: Podiatry

## 2019-09-16 DIAGNOSIS — G4733 Obstructive sleep apnea (adult) (pediatric): Secondary | ICD-10-CM | POA: Diagnosis not present

## 2019-10-08 DIAGNOSIS — L738 Other specified follicular disorders: Secondary | ICD-10-CM | POA: Diagnosis not present

## 2019-10-08 DIAGNOSIS — L821 Other seborrheic keratosis: Secondary | ICD-10-CM | POA: Diagnosis not present

## 2019-10-08 DIAGNOSIS — D1801 Hemangioma of skin and subcutaneous tissue: Secondary | ICD-10-CM | POA: Diagnosis not present

## 2019-10-08 DIAGNOSIS — L814 Other melanin hyperpigmentation: Secondary | ICD-10-CM | POA: Diagnosis not present

## 2019-10-08 DIAGNOSIS — D229 Melanocytic nevi, unspecified: Secondary | ICD-10-CM | POA: Diagnosis not present

## 2019-10-08 DIAGNOSIS — L578 Other skin changes due to chronic exposure to nonionizing radiation: Secondary | ICD-10-CM | POA: Diagnosis not present

## 2019-11-07 DIAGNOSIS — Z79899 Other long term (current) drug therapy: Secondary | ICD-10-CM | POA: Diagnosis not present

## 2019-11-07 DIAGNOSIS — K7581 Nonalcoholic steatohepatitis (NASH): Secondary | ICD-10-CM | POA: Diagnosis not present

## 2019-11-07 DIAGNOSIS — M1A0790 Idiopathic chronic gout, unspecified ankle and foot, without tophus (tophi): Secondary | ICD-10-CM | POA: Diagnosis not present

## 2019-11-07 DIAGNOSIS — E7800 Pure hypercholesterolemia, unspecified: Secondary | ICD-10-CM | POA: Diagnosis not present

## 2019-11-07 DIAGNOSIS — E119 Type 2 diabetes mellitus without complications: Secondary | ICD-10-CM | POA: Diagnosis not present

## 2019-11-07 DIAGNOSIS — I10 Essential (primary) hypertension: Secondary | ICD-10-CM | POA: Diagnosis not present

## 2019-11-08 DIAGNOSIS — Z79899 Other long term (current) drug therapy: Secondary | ICD-10-CM | POA: Diagnosis not present

## 2019-11-08 DIAGNOSIS — Z7984 Long term (current) use of oral hypoglycemic drugs: Secondary | ICD-10-CM | POA: Diagnosis not present

## 2019-11-08 DIAGNOSIS — E119 Type 2 diabetes mellitus without complications: Secondary | ICD-10-CM | POA: Diagnosis not present

## 2019-11-18 DIAGNOSIS — H6122 Impacted cerumen, left ear: Secondary | ICD-10-CM | POA: Diagnosis not present

## 2019-12-02 DIAGNOSIS — H5213 Myopia, bilateral: Secondary | ICD-10-CM | POA: Diagnosis not present

## 2019-12-02 DIAGNOSIS — H401131 Primary open-angle glaucoma, bilateral, mild stage: Secondary | ICD-10-CM | POA: Diagnosis not present

## 2019-12-23 IMAGING — CT CT TEMPORAL BONES W/O CM
2 of 4 series · 14 of 40 positions shown, 17 images · non-contrast
Comparison: None.

CLINICAL DATA: LEFT ear pain.

EXAM:
CT TEMPORAL BONES WITHOUT CONTRAST
TECHNIQUE: Axial and coronal plane CT imaging of the petrous temporal bones was
performed with thin-collimation image reconstruction. No intravenous
contrast was administered. Multiplanar CT image reconstructions were
also generated.

[Series 4: soft tissue · axial · 0.39mm/px · z∈[-146,-42]mm · 12 of 62 slices shown, 15 images]
[im 5/62  brain]
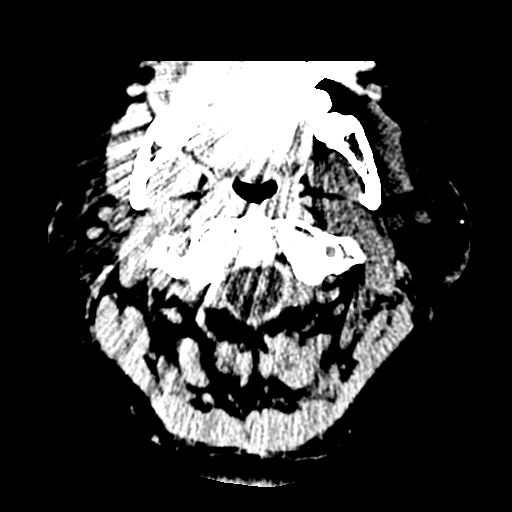
[im 5/62  bone]
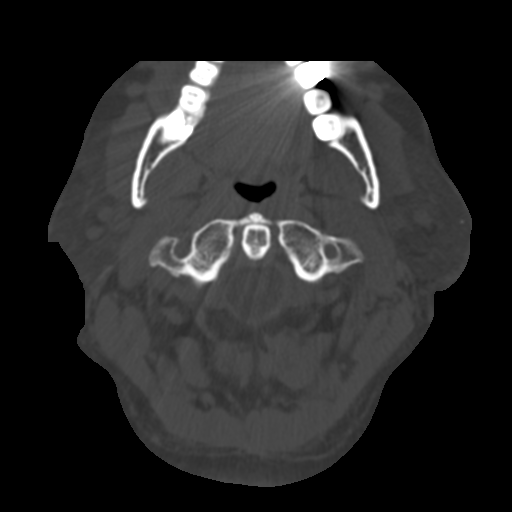
[im 9/62  bone]
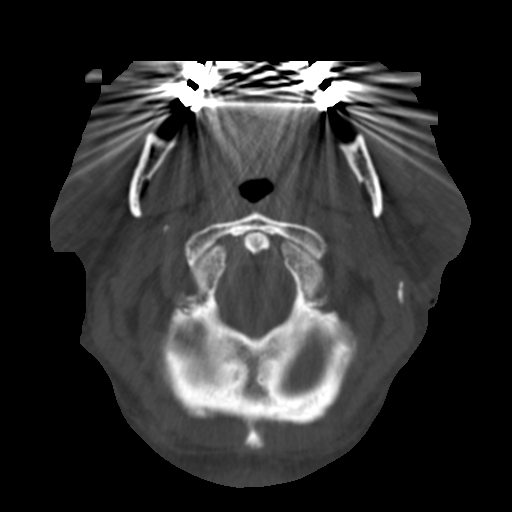
[im 13/62  bone]
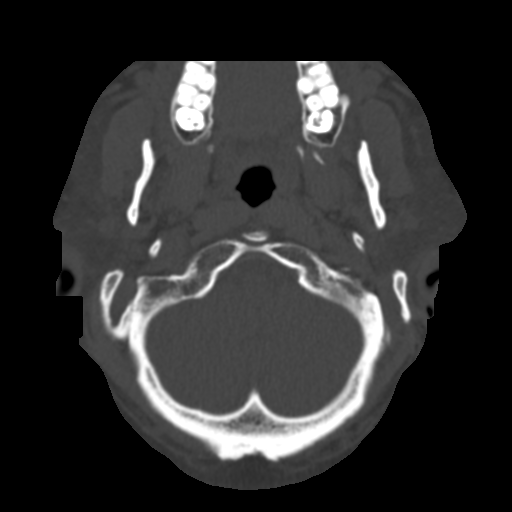
[im 19/62  bone]
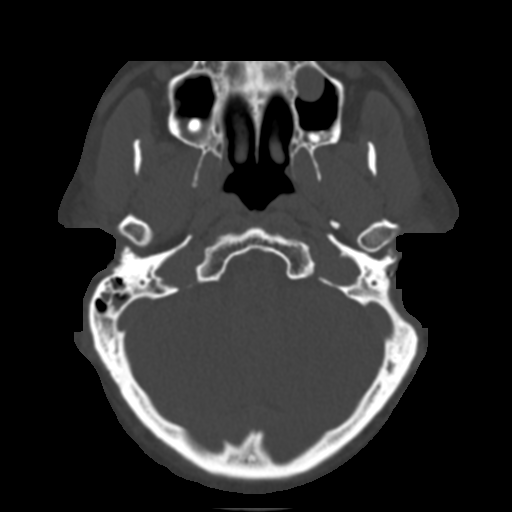
[im 24/62  brain]
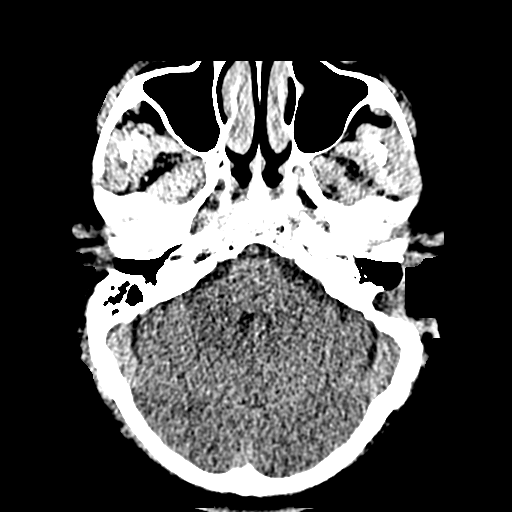
[im 24/62  bone]
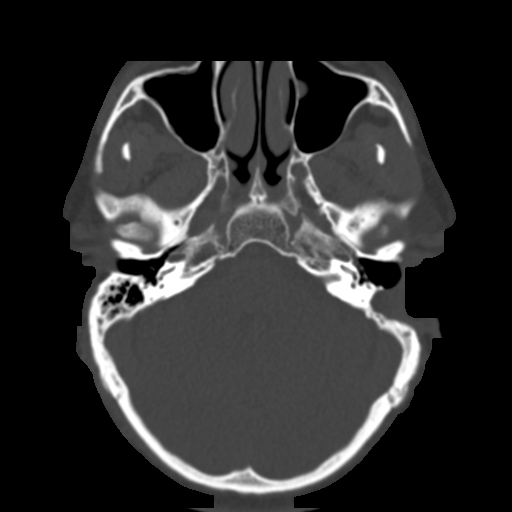
[im 28/62  bone]
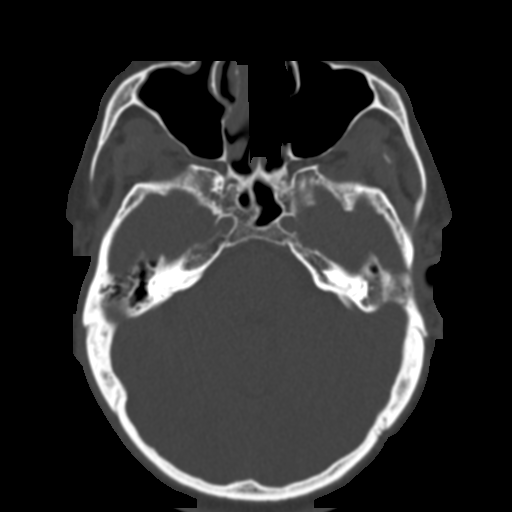
[im 34/62  bone]
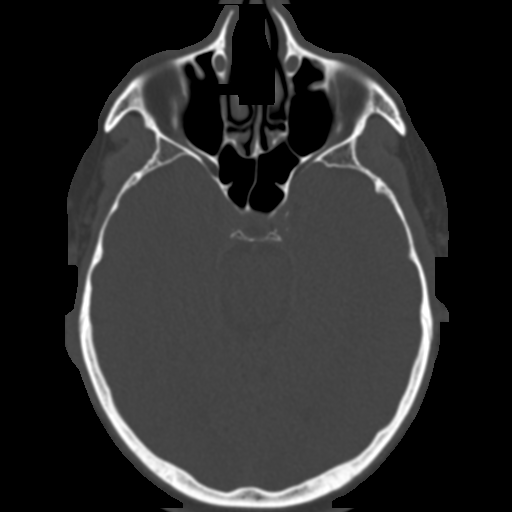
[im 38/62  bone]
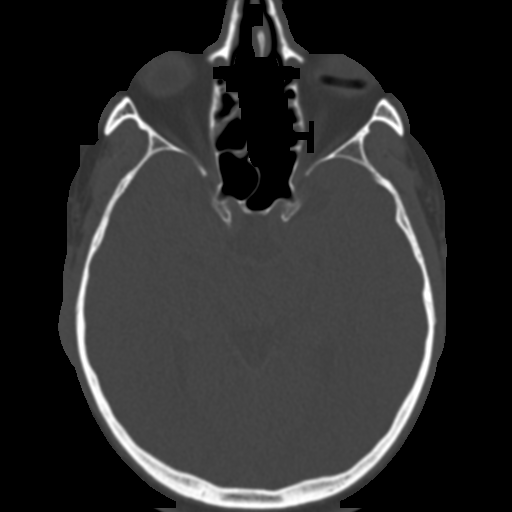
[im 43/62  brain]
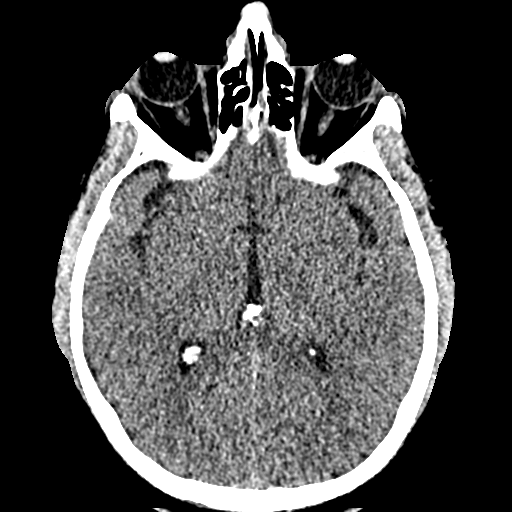
[im 43/62  bone]
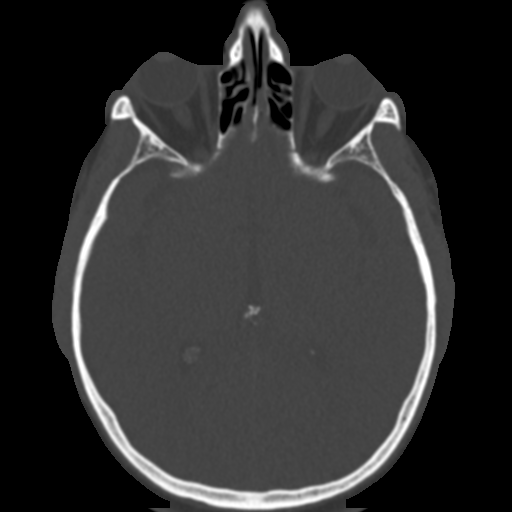
[im 49/62  bone]
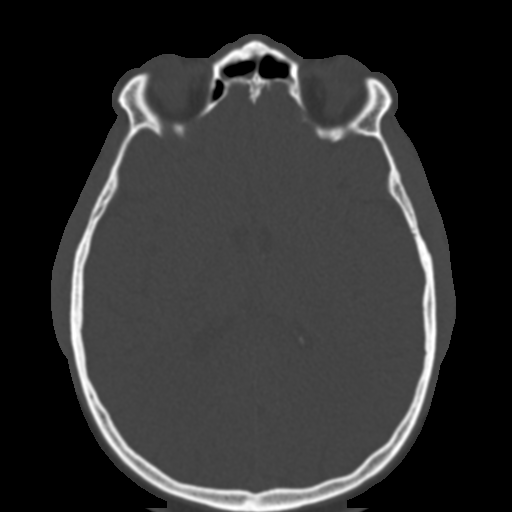
[im 53/62  bone]
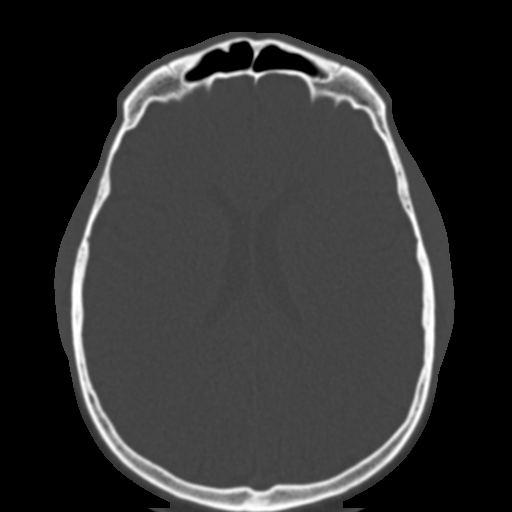
[im 57/62  bone]
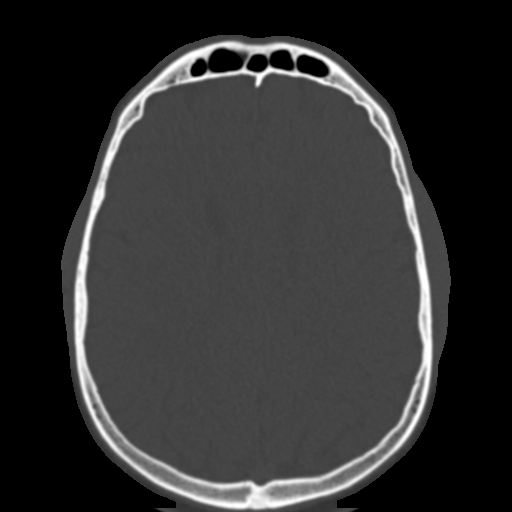

[Series 10: cor mag left · coronal · 0.20mm/px · 2 of 213 slices shown]
[im 71/213  bone]
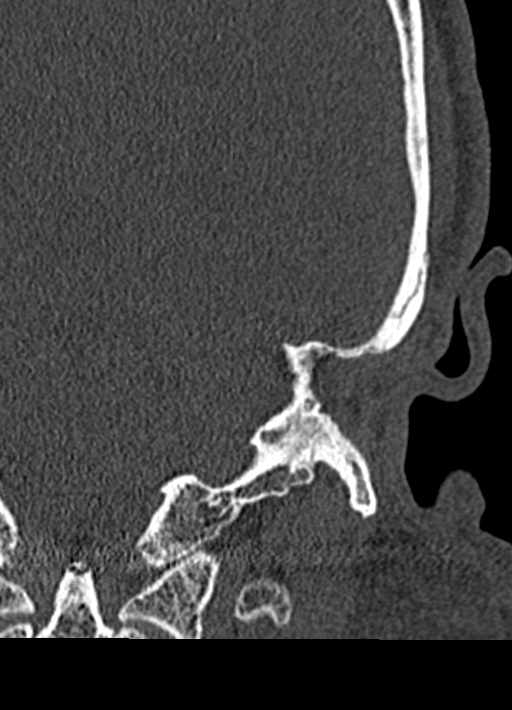
[im 142/213  bone]
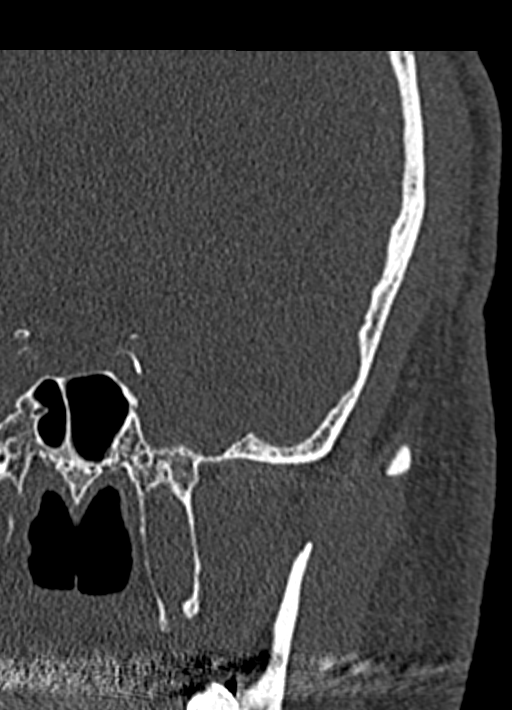

[14 of 40 positions shown; findings below may reference images not displayed]

FINDINGS: Debris in the RIGHT external canal, possible scarring adjacent to
the tympanic membrane. No definite tympanostomy tube. Middle ear
ossicles are intact. Minor fluid in the mastoid air cells, without
significant coalescence. Normal inner ear structures.

Changes of prior LEFT modified radical mastoidectomy. Debris in the
LEFT external canal. Retracted tympanic membrane. Ossicular
destruction versus surgical removal. Soft tissue in the middle ear
and mastoid, with coalescence and/or postsurgical change in the
mastoid cavity. Thinning, likely dehiscence, of tegmen tympani.
Normal inner ear structures including semicircular canals.
Unremarkable intracranial compartment. Negative orbits. Chronic
sinus disease.
IMPRESSION: Status post LEFT modified radical mastoidectomy, with residual soft
tissue in the middle ear and mastoid region, likely cholesteatoma.
Thinning, likely dehiscence, of tegmen tympani. Normal inner ear
structures.

## 2020-03-12 DIAGNOSIS — H95192 Other disorders following mastoidectomy, left ear: Secondary | ICD-10-CM | POA: Diagnosis not present

## 2020-04-16 DIAGNOSIS — H95192 Other disorders following mastoidectomy, left ear: Secondary | ICD-10-CM | POA: Diagnosis not present

## 2020-06-14 DIAGNOSIS — Z23 Encounter for immunization: Secondary | ICD-10-CM | POA: Diagnosis not present

## 2020-06-16 DIAGNOSIS — H401131 Primary open-angle glaucoma, bilateral, mild stage: Secondary | ICD-10-CM | POA: Diagnosis not present

## 2020-06-24 DIAGNOSIS — Z9009 Acquired absence of other part of head and neck: Secondary | ICD-10-CM | POA: Diagnosis not present

## 2020-06-24 DIAGNOSIS — H95192 Other disorders following mastoidectomy, left ear: Secondary | ICD-10-CM | POA: Diagnosis not present

## 2020-06-24 DIAGNOSIS — Z9889 Other specified postprocedural states: Secondary | ICD-10-CM | POA: Diagnosis not present

## 2020-06-26 DIAGNOSIS — H90A32 Mixed conductive and sensorineural hearing loss, unilateral, left ear with restricted hearing on the contralateral side: Secondary | ICD-10-CM | POA: Diagnosis not present

## 2020-09-23 DIAGNOSIS — E7800 Pure hypercholesterolemia, unspecified: Secondary | ICD-10-CM | POA: Diagnosis not present

## 2020-09-23 DIAGNOSIS — I10 Essential (primary) hypertension: Secondary | ICD-10-CM | POA: Diagnosis not present

## 2020-09-23 DIAGNOSIS — Z125 Encounter for screening for malignant neoplasm of prostate: Secondary | ICD-10-CM | POA: Diagnosis not present

## 2020-09-23 DIAGNOSIS — G4733 Obstructive sleep apnea (adult) (pediatric): Secondary | ICD-10-CM | POA: Diagnosis not present

## 2020-09-23 DIAGNOSIS — Z0289 Encounter for other administrative examinations: Secondary | ICD-10-CM | POA: Diagnosis not present

## 2020-09-23 DIAGNOSIS — M1A0790 Idiopathic chronic gout, unspecified ankle and foot, without tophus (tophi): Secondary | ICD-10-CM | POA: Diagnosis not present

## 2020-09-23 DIAGNOSIS — F331 Major depressive disorder, recurrent, moderate: Secondary | ICD-10-CM | POA: Diagnosis not present

## 2020-09-23 DIAGNOSIS — K7581 Nonalcoholic steatohepatitis (NASH): Secondary | ICD-10-CM | POA: Diagnosis not present

## 2020-09-23 DIAGNOSIS — E119 Type 2 diabetes mellitus without complications: Secondary | ICD-10-CM | POA: Diagnosis not present

## 2020-09-23 DIAGNOSIS — Z0001 Encounter for general adult medical examination with abnormal findings: Secondary | ICD-10-CM | POA: Diagnosis not present

## 2020-11-16 DIAGNOSIS — I10 Essential (primary) hypertension: Secondary | ICD-10-CM | POA: Diagnosis not present

## 2020-11-17 DIAGNOSIS — H524 Presbyopia: Secondary | ICD-10-CM | POA: Diagnosis not present

## 2020-11-17 DIAGNOSIS — H401131 Primary open-angle glaucoma, bilateral, mild stage: Secondary | ICD-10-CM | POA: Diagnosis not present

## 2020-11-17 DIAGNOSIS — H2513 Age-related nuclear cataract, bilateral: Secondary | ICD-10-CM | POA: Diagnosis not present

## 2020-11-17 DIAGNOSIS — E119 Type 2 diabetes mellitus without complications: Secondary | ICD-10-CM | POA: Diagnosis not present

## 2020-12-08 DIAGNOSIS — H6123 Impacted cerumen, bilateral: Secondary | ICD-10-CM | POA: Diagnosis not present

## 2020-12-18 DIAGNOSIS — Z23 Encounter for immunization: Secondary | ICD-10-CM | POA: Diagnosis not present

## 2021-01-26 DIAGNOSIS — Z9622 Myringotomy tube(s) status: Secondary | ICD-10-CM | POA: Diagnosis not present

## 2021-01-26 DIAGNOSIS — H9211 Otorrhea, right ear: Secondary | ICD-10-CM | POA: Diagnosis not present

## 2021-01-26 DIAGNOSIS — H938X2 Other specified disorders of left ear: Secondary | ICD-10-CM | POA: Diagnosis not present

## 2021-01-27 DIAGNOSIS — H02831 Dermatochalasis of right upper eyelid: Secondary | ICD-10-CM | POA: Diagnosis not present

## 2021-01-27 DIAGNOSIS — Z889 Allergy status to unspecified drugs, medicaments and biological substances status: Secondary | ICD-10-CM | POA: Diagnosis not present

## 2021-01-27 DIAGNOSIS — H0279 Other degenerative disorders of eyelid and periocular area: Secondary | ICD-10-CM | POA: Diagnosis not present

## 2021-01-27 DIAGNOSIS — H02834 Dermatochalasis of left upper eyelid: Secondary | ICD-10-CM | POA: Diagnosis not present

## 2021-01-27 DIAGNOSIS — H02413 Mechanical ptosis of bilateral eyelids: Secondary | ICD-10-CM | POA: Diagnosis not present

## 2021-01-27 DIAGNOSIS — H57813 Brow ptosis, bilateral: Secondary | ICD-10-CM | POA: Diagnosis not present

## 2021-01-27 DIAGNOSIS — H02411 Mechanical ptosis of right eyelid: Secondary | ICD-10-CM | POA: Diagnosis not present

## 2021-01-27 DIAGNOSIS — H02412 Mechanical ptosis of left eyelid: Secondary | ICD-10-CM | POA: Diagnosis not present

## 2021-01-27 DIAGNOSIS — H53483 Generalized contraction of visual field, bilateral: Secondary | ICD-10-CM | POA: Diagnosis not present

## 2021-02-08 DIAGNOSIS — H53481 Generalized contraction of visual field, right eye: Secondary | ICD-10-CM | POA: Diagnosis not present

## 2021-02-08 DIAGNOSIS — H53483 Generalized contraction of visual field, bilateral: Secondary | ICD-10-CM | POA: Diagnosis not present

## 2021-02-08 DIAGNOSIS — H53482 Generalized contraction of visual field, left eye: Secondary | ICD-10-CM | POA: Diagnosis not present

## 2021-02-10 DIAGNOSIS — R945 Abnormal results of liver function studies: Secondary | ICD-10-CM | POA: Diagnosis not present

## 2021-02-11 DIAGNOSIS — H6981 Other specified disorders of Eustachian tube, right ear: Secondary | ICD-10-CM | POA: Diagnosis not present

## 2021-02-11 DIAGNOSIS — Z9622 Myringotomy tube(s) status: Secondary | ICD-10-CM | POA: Diagnosis not present

## 2021-03-04 ENCOUNTER — Other Ambulatory Visit: Payer: Self-pay | Admitting: Family Medicine

## 2021-03-05 ENCOUNTER — Other Ambulatory Visit: Payer: Self-pay | Admitting: Family Medicine

## 2021-03-05 DIAGNOSIS — R7401 Elevation of levels of liver transaminase levels: Secondary | ICD-10-CM

## 2021-03-05 DIAGNOSIS — K7581 Nonalcoholic steatohepatitis (NASH): Secondary | ICD-10-CM

## 2021-03-19 ENCOUNTER — Ambulatory Visit
Admission: RE | Admit: 2021-03-19 | Discharge: 2021-03-19 | Disposition: A | Discharge status: Home / Self Care | Payer: Medicare Other | Source: Ambulatory Visit | Attending: Family Medicine | Admitting: Family Medicine

## 2021-03-19 ENCOUNTER — Other Ambulatory Visit: Payer: Self-pay

## 2021-03-19 DIAGNOSIS — K8020 Calculus of gallbladder without cholecystitis without obstruction: Secondary | ICD-10-CM | POA: Diagnosis not present

## 2021-03-19 DIAGNOSIS — R7401 Elevation of levels of liver transaminase levels: Secondary | ICD-10-CM

## 2021-03-19 DIAGNOSIS — K7581 Nonalcoholic steatohepatitis (NASH): Secondary | ICD-10-CM | POA: Diagnosis not present

## 2021-03-19 DIAGNOSIS — K760 Fatty (change of) liver, not elsewhere classified: Secondary | ICD-10-CM | POA: Diagnosis not present

## 2021-03-23 ENCOUNTER — Other Ambulatory Visit: Payer: Self-pay

## 2021-03-23 ENCOUNTER — Ambulatory Visit (INDEPENDENT_AMBULATORY_CARE_PROVIDER_SITE_OTHER): Payer: Medicare Other | Admitting: Allergy & Immunology

## 2021-03-23 ENCOUNTER — Encounter: Payer: Self-pay | Admitting: Allergy & Immunology

## 2021-03-23 VITALS — BP 138/78 | HR 81 | Temp 98.2°F | Resp 18 | Ht 71.0 in | Wt 252.2 lb

## 2021-03-23 DIAGNOSIS — T50905D Adverse effect of unspecified drugs, medicaments and biological substances, subsequent encounter: Secondary | ICD-10-CM

## 2021-03-23 NOTE — Progress Notes (Signed)
NEW PATIENT  Date of Service/Encounter:  03/23/21  Consult requested by: Lujean Amel, MD   Assessment:   History of local anesthetic allergy - with prick testing in challenge which were negative to both lidocaine and mepivacaine  History of penicillin allergy - needs observed challenge +/- testing  Plan/Recommendations:    1. Adverse effect of drug - We did testing to lidocaine and mepivacaine.  - You were negative to the testing and tolerated the full dose administration.  - This opens up both classes of local anesthetics for your surgery. - I will send this information to Dr. Leonard Schwartz.   2. Follow up as needed. We should consider doing penicillin testing and challenge in the future.    This note in its entirety was forwarded to the Provider who requested this consultation.  Subjective:   [name] is a 70 y.o. male presenting today for evaluation of  Chief Complaint  Patient presents with   Allergic Reaction    Possible lidicaine reaction having surgery    [name] has a history of the following: Patient Active Problem List   Diagnosis Date Noted   Eustachian tube dysfunction, bilateral 04/25/2018   Mixed conductive and sensorineural hearing loss of both ears 03/14/2018   Acute suppurative otitis media of left ear without spontaneous rupture of tympanic membrane 02/27/2018   Mastoiditis of left side 02/27/2018   Coronary artery disease    Obesity    Stress    Hyperlipidemia    Hypertension    Sleep apnea    Glaucoma    Tear of retina    Diabetes mellitus    LFT elevation     History obtained from: chart review and patient.  [name] was referred by Lujean Amel, MD.     [name] is a 70 y.o. male presenting for an evaluation of local anesthetic allergy .  He is going to have surgery in late September with Dr. Leonard Schwartz. He uses lidocaine.  His surgery is on his eyes.  [name] has reacted to Xylocaine in the past when he had a  plantar's wart on the bottom of his foot. This was in high school. He had swelling that did not progress to his knee. IT was not itchy but it did hurt. Symptoms continued for a period of one week. He went back to the doctor and [name] is not sure of the treatment.   He has had dental procedures and has been fine with this. They are just being extra careful with regards to the upcoming surgery.  He has never had any issues at the dentist office.  He is allergic to PCN but is unsure of the reaction. He was always told by his mother that he was allergic. He did have recurrent tonsillitis growing up, but otherwise to infectious history.  He does not remember the last time he needed antibiotics.  He is retired and previously worked for [employer] in [employer]. He retired in 2016. He was over more on the compensation and benefits side.  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   Otherwise, there is no history of other atopic diseases, including asthma, food allergies, environmental allergies, stinging insect allergies, eczema, urticaria, or contact dermatitis. There is no significant infectious history. Vaccinations are up to date.    Past Medical History: Patient Active Problem List   Diagnosis Date Noted   Eustachian tube dysfunction, bilateral 04/25/2018   Mixed conductive and sensorineural hearing loss of both ears 03/14/2018  Acute suppurative otitis media of left ear without spontaneous rupture of tympanic membrane 02/27/2018   Mastoiditis of left side 02/27/2018   Coronary artery disease    Obesity    Stress    Hyperlipidemia    Hypertension    Sleep apnea    Glaucoma    Tear of retina    Diabetes mellitus    LFT elevation     Medication List:  Allergies as of 03/23/2021       Reactions   Xylocaine [lidocaine] Swelling   Penicillins Other (See Comments)   Pt stated, "just know I can't take it"   Prednisone Other (See Comments)   Pt stated, "just a sensitivity"        Medication  List        Accurate as of March 23, 2021  2:45 PM. If you have any questions, ask your nurse or doctor.          STOP taking these medications    Ciprodex OTIC suspension Generic drug: ciprofloxacin-dexamethasone Stopped by: Valentina Shaggy, MD   diclofenac 75 MG EC tablet Commonly known as: VOLTAREN Stopped by: Valentina Shaggy, MD   doxycycline 100 MG capsule Commonly known as: VIBRAMYCIN Stopped by: Valentina Shaggy, MD   FLUoxetine 20 MG capsule Commonly known as: PROZAC Stopped by: Valentina Shaggy, MD   irbesartan 300 MG tablet Commonly known as: AVAPRO Stopped by: Valentina Shaggy, MD   naproxen 500 MG tablet Commonly known as: NAPROSYN Stopped by: Valentina Shaggy, MD   neomycin-polymyxin-hydrocortisone OTIC solution Commonly known as: CORTISPORIN Stopped by: Valentina Shaggy, MD   triamcinolone cream 0.1 % Commonly known as: KENALOG Stopped by: Valentina Shaggy, MD       TAKE these medications    amLODipine 5 MG tablet Commonly known as: NORVASC Take 1 tablet (5 mg total) by mouth daily.   aspirin 81 MG tablet Take 81 mg by mouth daily.   Aspirin-Calcium Carbonate 81-777 MG Tabs Take by mouth.   fish oil-omega-3 fatty acids 1000 MG capsule Take by mouth daily.   Lumigan 0.01 % Soln Generic drug: bimatoprost USE 1 DROP IN BOTH EYES AT BEDTIME   metFORMIN 1000 MG tablet Commonly known as: GLUCOPHAGE Take 1,000 mg by mouth 2 (two) times daily with a meal.   multivitamin tablet Take 1 tablet by mouth daily.   omeprazole 20 MG capsule Commonly known as: PRILOSEC TAKE 2 CAPSULES EVERY DAY IF NEEDED   OneTouch Verio test strip Generic drug: glucose blood USE AS DIRECTED ONCE A DAY   rosuvastatin 5 MG tablet Commonly known as: CRESTOR Take 5 mg by mouth 3 (three) times a week.   sertraline 50 MG tablet Commonly known as: ZOLOFT Take 50 mg by mouth daily.   valsartan-hydrochlorothiazide 320-25  MG tablet Commonly known as: DIOVAN-HCT Take 1 tablet by mouth daily.        Birth History: non-contributory  Developmental History: non-contributory  Past Surgical History: Past Surgical History:  Procedure Laterality Date   APPENDECTOMY     CARDIAC CATHETERIZATION  2006   INGUINAL HERNIA REPAIR     RIGHT   TONSILLECTOMY     VASECTOMY       Family History: Family History  Problem Relation Age of Onset   Heart failure Father    Alcohol abuse Brother      Social History: Cashmir lives at home with his wife.  They live in a condo that is 50 years old.  There is linoleum throughout the home.  They have carpet in the bedrooms.  They have a heat pump for heating and cooling.  There is a dog inside of the home.  There are no dust mite covers on the bedding.  There is no tobacco exposure.  He currently is retired.  They do have a HEPA filter.  There is no exposure to fumes, chemicals, or dust.  They do not live near an interstate or industrial area.   Review of Systems  Constitutional: Negative.  Negative for fever, malaise/fatigue and weight loss.  HENT: Negative.  Negative for congestion, ear discharge and ear pain.   Eyes:  Negative for pain, discharge and redness.  Respiratory:  Negative for cough, sputum production, shortness of breath and wheezing.   Cardiovascular: Negative.  Negative for chest pain and palpitations.  Gastrointestinal:  Negative for abdominal pain, heartburn, nausea and vomiting.  Skin: Negative.  Negative for itching and rash.  Neurological:  Negative for dizziness and headaches.  Endo/Heme/Allergies:  Negative for environmental allergies. Does not bruise/bleed easily.      Objective:   Blood pressure 138/78, pulse 81, temperature 98.2 F (36.8 C), temperature source Temporal, resp. rate 18, height '5\' 11"'$  (1.803 m), weight 252 lb 3.2 oz (114.4 kg), SpO2 95 %. Body mass index is 35.17 kg/m.   Physical Exam:   Physical Exam Vitals reviewed.   Constitutional:      Appearance: He is well-developed.     Comments: Boisterous.  HENT:     Head: Normocephalic and atraumatic.     Right Ear: Tympanic membrane, ear canal and external ear normal. No drainage, swelling or tenderness. Tympanic membrane is not injected, scarred, erythematous, retracted or bulging.     Left Ear: Tympanic membrane, ear canal and external ear normal. No drainage, swelling or tenderness. Tympanic membrane is not injected, scarred, erythematous, retracted or bulging.     Nose: No nasal deformity, septal deviation, mucosal edema or rhinorrhea.     Right Sinus: No maxillary sinus tenderness or frontal sinus tenderness.     Left Sinus: No maxillary sinus tenderness or frontal sinus tenderness.     Mouth/Throat:     Mouth: Mucous membranes are not pale and not dry.     Pharynx: Uvula midline.  Eyes:     General:        Right eye: No discharge.        Left eye: No discharge.     Conjunctiva/sclera: Conjunctivae normal.     Right eye: Right conjunctiva is not injected. No chemosis.    Left eye: Left conjunctiva is not injected. No chemosis.    Pupils: Pupils are equal, round, and reactive to light.  Cardiovascular:     Rate and Rhythm: Normal rate and regular rhythm.     Heart sounds: Normal heart sounds.  Pulmonary:     Effort: Pulmonary effort is normal. No tachypnea, accessory muscle usage or respiratory distress.     Breath sounds: Normal breath sounds. No wheezing, rhonchi or rales.  Chest:     Chest wall: No tenderness.  Lymphadenopathy:     Head:     Right side of head: No submandibular, tonsillar or occipital adenopathy.     Left side of head: No submandibular, tonsillar or occipital adenopathy.     Cervical: No cervical adenopathy.  Skin:    Coloration: Skin is not pale.     Findings: No abrasion, erythema, petechiae or rash. Rash is not papular, urticarial or vesicular.  Neurological:     Mental Status: He is alert.     Diagnostic studies:     Local Anesthetic Percutaneous Testing (lidocaine) Control SPT: negative Histamine SPT: 2+ SPT: negative  Local Anesthetic Intradermal Testing (lidocaine) Control ID: negative  1/100 ID: negative 1/10 ID: negative Full strength ID: negative  Local Anesthetic Subcutaneous Challenge Testing (lidocaine) 0.33m full strength: NOT PERFORMED 0.534mfull strength: negative 1.62m38mull strength: negative    Local Anesthetic Percutaneous Testing (mepivacaine) Control SPT: negative Histamine SPT: 2+ SPT: negative  Local Anesthetic Intradermal Testing (mepivacaine) Control ID: negative  1/100 ID: negative 1/10 ID: negative Full strength ID: negative  Local Anesthetic Subcutaneous Challenge Testing (mepivacaine) 0.1mL72mll strength: NOT PERFORMED 0.5mL 72ml strength: negative 1.62mL f34m strength: negative     Allergy testing results were read and interpreted by myself, documented by clinical staff.         Jamarii Banks [facility] [place]

## 2021-03-23 NOTE — Patient Instructions (Addendum)
1. Adverse effect of drug - We did testing to lidocaine and mepivacaine.  - You were negative to the testing and tolerated the full dose administration.  - This opens up both classes of local anesthetics for your surgery. - I will send this information to Dr. Leonard Schwartz.   2. Follow up as needed. We should consider doing penicillin testing and challenge in the future.    Please inform us of any Emergency Department visits, hospitalizations, or changes in symptoms. Call us before going to the ED for breathing or allergy symptoms since we might be able to fit you in for a sick visit. Feel free to contact us anytime with any questions, problems, or concerns.  It was a pleasure to meet you today!  Websites that have reliable patient information: 1. American Academy of Asthma, Allergy, and Immunology: www.aaaai.org 2. Food Allergy Research and Education (FARE): foodallergy.org 3. Mothers of Asthmatics: http://www.asthmacommunitynetwork.org 4. American College of Allergy, Asthma, and Immunology: www.acaai.org   COVID-19 Vaccine Information can be found at: ShippingScam.co.uk For questions related to vaccine distribution or appointments, please email [email or web address] or call [phone].   We realize that you might be concerned about having an allergic reaction to the COVID19 vaccines. To help with that concern, WE ARE OFFERING THE COVID19 VACCINES IN OUR OFFICE! Ask the front desk for dates!     "Like" [place] on Facebook and Instagram for our latest updates!      A healthy democracy works best when [employer] participate! Make sure you are registered to vote! If you have moved or changed any of your contact information, you will need to get this updated before voting!  In some cases, you MAY be able to register to vote online: [email or web address]

## 2021-04-20 ENCOUNTER — Ambulatory Visit
Admission: RE | Admit: 2021-04-20 | Discharge: 2021-04-20 | Disposition: A | Discharge status: Home / Self Care | Payer: Medicare Other | Source: Ambulatory Visit | Attending: Family Medicine | Admitting: Family Medicine

## 2021-04-20 ENCOUNTER — Other Ambulatory Visit: Payer: Self-pay | Admitting: Family Medicine

## 2021-04-20 DIAGNOSIS — M79674 Pain in right toe(s): Secondary | ICD-10-CM | POA: Diagnosis not present

## 2021-04-20 DIAGNOSIS — M7989 Other specified soft tissue disorders: Secondary | ICD-10-CM | POA: Diagnosis not present

## 2021-04-23 DIAGNOSIS — H401131 Primary open-angle glaucoma, bilateral, mild stage: Secondary | ICD-10-CM | POA: Diagnosis not present

## 2021-04-28 DIAGNOSIS — H02831 Dermatochalasis of right upper eyelid: Secondary | ICD-10-CM | POA: Diagnosis not present

## 2021-04-28 DIAGNOSIS — H53483 Generalized contraction of visual field, bilateral: Secondary | ICD-10-CM | POA: Diagnosis not present

## 2021-04-28 DIAGNOSIS — H02413 Mechanical ptosis of bilateral eyelids: Secondary | ICD-10-CM | POA: Diagnosis not present

## 2021-04-28 DIAGNOSIS — H02412 Mechanical ptosis of left eyelid: Secondary | ICD-10-CM | POA: Diagnosis not present

## 2021-04-28 DIAGNOSIS — H57813 Brow ptosis, bilateral: Secondary | ICD-10-CM | POA: Diagnosis not present

## 2021-04-28 DIAGNOSIS — H02411 Mechanical ptosis of right eyelid: Secondary | ICD-10-CM | POA: Diagnosis not present

## 2021-04-28 DIAGNOSIS — H02834 Dermatochalasis of left upper eyelid: Secondary | ICD-10-CM | POA: Diagnosis not present

## 2021-06-03 DIAGNOSIS — H95192 Other disorders following mastoidectomy, left ear: Secondary | ICD-10-CM | POA: Diagnosis not present

## 2021-06-03 DIAGNOSIS — Z9089 Acquired absence of other organs: Secondary | ICD-10-CM | POA: Diagnosis not present

## 2021-06-03 DIAGNOSIS — H6121 Impacted cerumen, right ear: Secondary | ICD-10-CM | POA: Diagnosis not present

## 2021-06-23 DIAGNOSIS — Z23 Encounter for immunization: Secondary | ICD-10-CM | POA: Diagnosis not present

## 2021-06-28 DIAGNOSIS — Z23 Encounter for immunization: Secondary | ICD-10-CM | POA: Diagnosis not present

## 2021-08-23 DIAGNOSIS — H401131 Primary open-angle glaucoma, bilateral, mild stage: Secondary | ICD-10-CM | POA: Diagnosis not present

## 2021-09-21 DIAGNOSIS — Z9089 Acquired absence of other organs: Secondary | ICD-10-CM | POA: Diagnosis not present

## 2021-09-21 DIAGNOSIS — H95192 Other disorders following mastoidectomy, left ear: Secondary | ICD-10-CM | POA: Diagnosis not present

## 2021-09-29 DIAGNOSIS — H2513 Age-related nuclear cataract, bilateral: Secondary | ICD-10-CM | POA: Diagnosis not present

## 2021-09-29 DIAGNOSIS — G4733 Obstructive sleep apnea (adult) (pediatric): Secondary | ICD-10-CM | POA: Diagnosis not present

## 2021-09-29 DIAGNOSIS — H0234 Blepharochalasis left upper eyelid: Secondary | ICD-10-CM | POA: Diagnosis not present

## 2021-09-29 DIAGNOSIS — H0231 Blepharochalasis right upper eyelid: Secondary | ICD-10-CM | POA: Diagnosis not present

## 2021-09-29 DIAGNOSIS — H57813 Brow ptosis, bilateral: Secondary | ICD-10-CM | POA: Diagnosis not present

## 2021-10-11 DIAGNOSIS — F331 Major depressive disorder, recurrent, moderate: Secondary | ICD-10-CM | POA: Diagnosis not present

## 2021-10-11 DIAGNOSIS — I10 Essential (primary) hypertension: Secondary | ICD-10-CM | POA: Diagnosis not present

## 2021-10-11 DIAGNOSIS — E1169 Type 2 diabetes mellitus with other specified complication: Secondary | ICD-10-CM | POA: Diagnosis not present

## 2021-10-11 DIAGNOSIS — Z0001 Encounter for general adult medical examination with abnormal findings: Secondary | ICD-10-CM | POA: Diagnosis not present

## 2021-10-11 DIAGNOSIS — E7800 Pure hypercholesterolemia, unspecified: Secondary | ICD-10-CM | POA: Diagnosis not present

## 2021-10-11 DIAGNOSIS — Z79899 Other long term (current) drug therapy: Secondary | ICD-10-CM | POA: Diagnosis not present

## 2021-10-11 DIAGNOSIS — M1A0790 Idiopathic chronic gout, unspecified ankle and foot, without tophus (tophi): Secondary | ICD-10-CM | POA: Diagnosis not present

## 2021-10-11 DIAGNOSIS — Z125 Encounter for screening for malignant neoplasm of prostate: Secondary | ICD-10-CM | POA: Diagnosis not present

## 2021-10-11 DIAGNOSIS — E1165 Type 2 diabetes mellitus with hyperglycemia: Secondary | ICD-10-CM | POA: Diagnosis not present

## 2021-10-11 DIAGNOSIS — K7581 Nonalcoholic steatohepatitis (NASH): Secondary | ICD-10-CM | POA: Diagnosis not present

## 2021-10-11 DIAGNOSIS — Z23 Encounter for immunization: Secondary | ICD-10-CM | POA: Diagnosis not present

## 2021-10-13 DIAGNOSIS — R051 Acute cough: Secondary | ICD-10-CM | POA: Diagnosis not present

## 2021-12-23 DIAGNOSIS — H401131 Primary open-angle glaucoma, bilateral, mild stage: Secondary | ICD-10-CM | POA: Diagnosis not present

## 2022-01-12 DIAGNOSIS — Z23 Encounter for immunization: Secondary | ICD-10-CM | POA: Diagnosis not present

## 2022-02-14 DIAGNOSIS — H6990 Unspecified Eustachian tube disorder, unspecified ear: Secondary | ICD-10-CM | POA: Diagnosis not present

## 2022-02-14 DIAGNOSIS — H6123 Impacted cerumen, bilateral: Secondary | ICD-10-CM | POA: Diagnosis not present

## 2022-06-13 DIAGNOSIS — Z9089 Acquired absence of other organs: Secondary | ICD-10-CM | POA: Diagnosis not present

## 2022-06-13 DIAGNOSIS — Z9622 Myringotomy tube(s) status: Secondary | ICD-10-CM | POA: Diagnosis not present

## 2022-06-15 DIAGNOSIS — Z23 Encounter for immunization: Secondary | ICD-10-CM | POA: Diagnosis not present

## 2022-06-15 DIAGNOSIS — E7800 Pure hypercholesterolemia, unspecified: Secondary | ICD-10-CM | POA: Diagnosis not present

## 2022-06-15 DIAGNOSIS — F331 Major depressive disorder, recurrent, moderate: Secondary | ICD-10-CM | POA: Diagnosis not present

## 2022-06-15 DIAGNOSIS — I10 Essential (primary) hypertension: Secondary | ICD-10-CM | POA: Diagnosis not present

## 2022-06-15 DIAGNOSIS — M1A0790 Idiopathic chronic gout, unspecified ankle and foot, without tophus (tophi): Secondary | ICD-10-CM | POA: Diagnosis not present

## 2022-06-15 DIAGNOSIS — K7581 Nonalcoholic steatohepatitis (NASH): Secondary | ICD-10-CM | POA: Diagnosis not present

## 2022-06-15 DIAGNOSIS — E1169 Type 2 diabetes mellitus with other specified complication: Secondary | ICD-10-CM | POA: Diagnosis not present

## 2022-06-30 DIAGNOSIS — Z79899 Other long term (current) drug therapy: Secondary | ICD-10-CM | POA: Diagnosis not present

## 2022-07-15 DIAGNOSIS — Z79899 Other long term (current) drug therapy: Secondary | ICD-10-CM | POA: Diagnosis not present

## 2022-09-01 DIAGNOSIS — E7800 Pure hypercholesterolemia, unspecified: Secondary | ICD-10-CM | POA: Diagnosis not present

## 2022-09-01 DIAGNOSIS — E1169 Type 2 diabetes mellitus with other specified complication: Secondary | ICD-10-CM | POA: Diagnosis not present

## 2022-09-01 DIAGNOSIS — Z79899 Other long term (current) drug therapy: Secondary | ICD-10-CM | POA: Diagnosis not present

## 2022-10-03 DIAGNOSIS — G4733 Obstructive sleep apnea (adult) (pediatric): Secondary | ICD-10-CM | POA: Diagnosis not present

## 2022-10-14 DIAGNOSIS — H401131 Primary open-angle glaucoma, bilateral, mild stage: Secondary | ICD-10-CM | POA: Diagnosis not present

## 2022-10-14 DIAGNOSIS — H524 Presbyopia: Secondary | ICD-10-CM | POA: Diagnosis not present

## 2022-10-17 DIAGNOSIS — E1169 Type 2 diabetes mellitus with other specified complication: Secondary | ICD-10-CM | POA: Diagnosis not present

## 2022-10-17 DIAGNOSIS — Z0001 Encounter for general adult medical examination with abnormal findings: Secondary | ICD-10-CM | POA: Diagnosis not present

## 2022-10-17 DIAGNOSIS — E7800 Pure hypercholesterolemia, unspecified: Secondary | ICD-10-CM | POA: Diagnosis not present

## 2022-10-17 DIAGNOSIS — M1A0790 Idiopathic chronic gout, unspecified ankle and foot, without tophus (tophi): Secondary | ICD-10-CM | POA: Diagnosis not present

## 2022-10-17 DIAGNOSIS — Z125 Encounter for screening for malignant neoplasm of prostate: Secondary | ICD-10-CM | POA: Diagnosis not present

## 2022-10-17 DIAGNOSIS — Z79899 Other long term (current) drug therapy: Secondary | ICD-10-CM | POA: Diagnosis not present

## 2022-10-19 DIAGNOSIS — E7800 Pure hypercholesterolemia, unspecified: Secondary | ICD-10-CM | POA: Diagnosis not present

## 2022-10-19 DIAGNOSIS — I10 Essential (primary) hypertension: Secondary | ICD-10-CM | POA: Diagnosis not present

## 2022-10-19 DIAGNOSIS — Z79899 Other long term (current) drug therapy: Secondary | ICD-10-CM | POA: Diagnosis not present

## 2022-10-19 DIAGNOSIS — F331 Major depressive disorder, recurrent, moderate: Secondary | ICD-10-CM | POA: Diagnosis not present

## 2022-10-19 DIAGNOSIS — M1A0790 Idiopathic chronic gout, unspecified ankle and foot, without tophus (tophi): Secondary | ICD-10-CM | POA: Diagnosis not present

## 2022-10-19 DIAGNOSIS — E1169 Type 2 diabetes mellitus with other specified complication: Secondary | ICD-10-CM | POA: Diagnosis not present

## 2022-10-19 DIAGNOSIS — Z0001 Encounter for general adult medical examination with abnormal findings: Secondary | ICD-10-CM | POA: Diagnosis not present

## 2022-11-24 DIAGNOSIS — H6123 Impacted cerumen, bilateral: Secondary | ICD-10-CM | POA: Diagnosis not present

## 2022-11-24 DIAGNOSIS — H95192 Other disorders following mastoidectomy, left ear: Secondary | ICD-10-CM | POA: Diagnosis not present

## 2022-11-24 DIAGNOSIS — H90A32 Mixed conductive and sensorineural hearing loss, unilateral, left ear with restricted hearing on the contralateral side: Secondary | ICD-10-CM | POA: Diagnosis not present

## 2022-12-01 DIAGNOSIS — E7800 Pure hypercholesterolemia, unspecified: Secondary | ICD-10-CM | POA: Diagnosis not present

## 2022-12-18 DIAGNOSIS — L089 Local infection of the skin and subcutaneous tissue, unspecified: Secondary | ICD-10-CM | POA: Diagnosis not present

## 2023-02-24 DIAGNOSIS — H401131 Primary open-angle glaucoma, bilateral, mild stage: Secondary | ICD-10-CM | POA: Diagnosis not present

## 2023-03-10 ENCOUNTER — Other Ambulatory Visit: Payer: Self-pay | Admitting: Family Medicine

## 2023-03-10 ENCOUNTER — Ambulatory Visit
Admission: RE | Admit: 2023-03-10 | Discharge: 2023-03-10 | Disposition: A | Discharge status: Home / Self Care | Payer: Medicare Other | Source: Ambulatory Visit | Attending: Family Medicine | Admitting: Family Medicine

## 2023-03-10 DIAGNOSIS — M5416 Radiculopathy, lumbar region: Secondary | ICD-10-CM

## 2023-03-10 DIAGNOSIS — E119 Type 2 diabetes mellitus without complications: Secondary | ICD-10-CM | POA: Diagnosis not present

## 2023-03-10 DIAGNOSIS — M25551 Pain in right hip: Secondary | ICD-10-CM | POA: Diagnosis not present

## 2023-03-10 DIAGNOSIS — M47814 Spondylosis without myelopathy or radiculopathy, thoracic region: Secondary | ICD-10-CM | POA: Diagnosis not present

## 2023-03-10 DIAGNOSIS — M545 Low back pain, unspecified: Secondary | ICD-10-CM | POA: Diagnosis not present

## 2023-03-27 DIAGNOSIS — Z9622 Myringotomy tube(s) status: Secondary | ICD-10-CM | POA: Diagnosis not present

## 2023-03-27 DIAGNOSIS — Z9089 Acquired absence of other organs: Secondary | ICD-10-CM | POA: Diagnosis not present

## 2023-03-27 DIAGNOSIS — H6123 Impacted cerumen, bilateral: Secondary | ICD-10-CM | POA: Diagnosis not present

## 2023-03-27 DIAGNOSIS — H90A32 Mixed conductive and sensorineural hearing loss, unilateral, left ear with restricted hearing on the contralateral side: Secondary | ICD-10-CM | POA: Diagnosis not present

## 2023-03-28 DIAGNOSIS — M545 Low back pain, unspecified: Secondary | ICD-10-CM | POA: Diagnosis not present

## 2023-04-18 DIAGNOSIS — M5136 Other intervertebral disc degeneration, lumbar region: Secondary | ICD-10-CM | POA: Diagnosis not present

## 2023-04-18 DIAGNOSIS — M4306 Spondylolysis, lumbar region: Secondary | ICD-10-CM | POA: Diagnosis not present

## 2023-04-24 DIAGNOSIS — M5136 Other intervertebral disc degeneration, lumbar region: Secondary | ICD-10-CM | POA: Diagnosis not present

## 2023-04-24 DIAGNOSIS — M4306 Spondylolysis, lumbar region: Secondary | ICD-10-CM | POA: Diagnosis not present

## 2023-05-03 DIAGNOSIS — M4306 Spondylolysis, lumbar region: Secondary | ICD-10-CM | POA: Diagnosis not present

## 2023-05-03 DIAGNOSIS — M5136 Other intervertebral disc degeneration, lumbar region: Secondary | ICD-10-CM | POA: Diagnosis not present

## 2023-05-10 DIAGNOSIS — M4306 Spondylolysis, lumbar region: Secondary | ICD-10-CM | POA: Diagnosis not present

## 2023-05-10 DIAGNOSIS — M5136 Other intervertebral disc degeneration, lumbar region: Secondary | ICD-10-CM | POA: Diagnosis not present

## 2023-05-11 DIAGNOSIS — I10 Essential (primary) hypertension: Secondary | ICD-10-CM | POA: Diagnosis not present

## 2023-05-11 DIAGNOSIS — Z79899 Other long term (current) drug therapy: Secondary | ICD-10-CM | POA: Diagnosis not present

## 2023-05-11 DIAGNOSIS — Z23 Encounter for immunization: Secondary | ICD-10-CM | POA: Diagnosis not present

## 2023-05-11 DIAGNOSIS — E7800 Pure hypercholesterolemia, unspecified: Secondary | ICD-10-CM | POA: Diagnosis not present

## 2023-05-11 DIAGNOSIS — F331 Major depressive disorder, recurrent, moderate: Secondary | ICD-10-CM | POA: Diagnosis not present

## 2023-05-11 DIAGNOSIS — E1169 Type 2 diabetes mellitus with other specified complication: Secondary | ICD-10-CM | POA: Diagnosis not present

## 2023-05-11 DIAGNOSIS — M1A0790 Idiopathic chronic gout, unspecified ankle and foot, without tophus (tophi): Secondary | ICD-10-CM | POA: Diagnosis not present

## 2023-05-13 DIAGNOSIS — Z23 Encounter for immunization: Secondary | ICD-10-CM | POA: Diagnosis not present

## 2023-05-16 DIAGNOSIS — M5186 Other intervertebral disc disorders, lumbar region: Secondary | ICD-10-CM | POA: Diagnosis not present

## 2023-05-16 DIAGNOSIS — M4306 Spondylolysis, lumbar region: Secondary | ICD-10-CM | POA: Diagnosis not present

## 2023-05-31 DIAGNOSIS — M51369 Other intervertebral disc degeneration, lumbar region without mention of lumbar back pain or lower extremity pain: Secondary | ICD-10-CM | POA: Diagnosis not present

## 2023-05-31 DIAGNOSIS — M4306 Spondylolysis, lumbar region: Secondary | ICD-10-CM | POA: Diagnosis not present

## 2023-06-14 DIAGNOSIS — M5136 Other intervertebral disc degeneration, lumbar region with discogenic back pain only: Secondary | ICD-10-CM | POA: Diagnosis not present

## 2023-06-14 DIAGNOSIS — M4306 Spondylolysis, lumbar region: Secondary | ICD-10-CM | POA: Diagnosis not present

## 2023-06-26 DIAGNOSIS — H401131 Primary open-angle glaucoma, bilateral, mild stage: Secondary | ICD-10-CM | POA: Diagnosis not present

## 2023-07-09 DIAGNOSIS — J988 Other specified respiratory disorders: Secondary | ICD-10-CM | POA: Diagnosis not present

## 2023-07-09 DIAGNOSIS — R066 Hiccough: Secondary | ICD-10-CM | POA: Diagnosis not present

## 2023-07-26 DIAGNOSIS — R059 Cough, unspecified: Secondary | ICD-10-CM | POA: Diagnosis not present

## 2023-07-27 DIAGNOSIS — Z9089 Acquired absence of other organs: Secondary | ICD-10-CM | POA: Diagnosis not present

## 2023-07-27 DIAGNOSIS — H90A32 Mixed conductive and sensorineural hearing loss, unilateral, left ear with restricted hearing on the contralateral side: Secondary | ICD-10-CM | POA: Diagnosis not present

## 2023-07-27 DIAGNOSIS — H6123 Impacted cerumen, bilateral: Secondary | ICD-10-CM | POA: Diagnosis not present

## 2023-07-27 DIAGNOSIS — Z9622 Myringotomy tube(s) status: Secondary | ICD-10-CM | POA: Diagnosis not present

## 2023-08-23 DIAGNOSIS — M1611 Unilateral primary osteoarthritis, right hip: Secondary | ICD-10-CM | POA: Diagnosis not present

## 2023-08-23 DIAGNOSIS — M25561 Pain in right knee: Secondary | ICD-10-CM | POA: Diagnosis not present

## 2023-09-07 DIAGNOSIS — E1169 Type 2 diabetes mellitus with other specified complication: Secondary | ICD-10-CM | POA: Diagnosis not present

## 2023-09-07 DIAGNOSIS — Z79899 Other long term (current) drug therapy: Secondary | ICD-10-CM | POA: Diagnosis not present

## 2023-09-07 DIAGNOSIS — D649 Anemia, unspecified: Secondary | ICD-10-CM | POA: Diagnosis not present

## 2023-09-07 DIAGNOSIS — I10 Essential (primary) hypertension: Secondary | ICD-10-CM | POA: Diagnosis not present

## 2023-09-07 DIAGNOSIS — Z01818 Encounter for other preprocedural examination: Secondary | ICD-10-CM | POA: Diagnosis not present

## 2023-09-28 DIAGNOSIS — M25751 Osteophyte, right hip: Secondary | ICD-10-CM | POA: Diagnosis not present

## 2023-09-28 DIAGNOSIS — M1611 Unilateral primary osteoarthritis, right hip: Secondary | ICD-10-CM | POA: Diagnosis not present

## 2023-10-04 DIAGNOSIS — M1611 Unilateral primary osteoarthritis, right hip: Secondary | ICD-10-CM | POA: Diagnosis not present

## 2023-10-09 DIAGNOSIS — M1611 Unilateral primary osteoarthritis, right hip: Secondary | ICD-10-CM | POA: Diagnosis not present

## 2023-10-11 DIAGNOSIS — M1611 Unilateral primary osteoarthritis, right hip: Secondary | ICD-10-CM | POA: Diagnosis not present

## 2023-10-16 DIAGNOSIS — M1611 Unilateral primary osteoarthritis, right hip: Secondary | ICD-10-CM | POA: Diagnosis not present

## 2023-11-06 DIAGNOSIS — M1611 Unilateral primary osteoarthritis, right hip: Secondary | ICD-10-CM | POA: Diagnosis not present

## 2023-11-08 DIAGNOSIS — M1611 Unilateral primary osteoarthritis, right hip: Secondary | ICD-10-CM | POA: Diagnosis not present

## 2023-12-06 DIAGNOSIS — G4733 Obstructive sleep apnea (adult) (pediatric): Secondary | ICD-10-CM | POA: Diagnosis not present

## 2023-12-07 DIAGNOSIS — H90A32 Mixed conductive and sensorineural hearing loss, unilateral, left ear with restricted hearing on the contralateral side: Secondary | ICD-10-CM | POA: Diagnosis not present

## 2023-12-07 DIAGNOSIS — H6123 Impacted cerumen, bilateral: Secondary | ICD-10-CM | POA: Diagnosis not present

## 2023-12-07 DIAGNOSIS — Z9622 Myringotomy tube(s) status: Secondary | ICD-10-CM | POA: Diagnosis not present

## 2023-12-07 DIAGNOSIS — Z9089 Acquired absence of other organs: Secondary | ICD-10-CM | POA: Diagnosis not present

## 2023-12-20 DIAGNOSIS — M1611 Unilateral primary osteoarthritis, right hip: Secondary | ICD-10-CM | POA: Diagnosis not present

## 2023-12-25 DIAGNOSIS — H524 Presbyopia: Secondary | ICD-10-CM | POA: Diagnosis not present

## 2023-12-25 DIAGNOSIS — H2513 Age-related nuclear cataract, bilateral: Secondary | ICD-10-CM | POA: Diagnosis not present

## 2023-12-25 DIAGNOSIS — E119 Type 2 diabetes mellitus without complications: Secondary | ICD-10-CM | POA: Diagnosis not present

## 2023-12-25 DIAGNOSIS — H401131 Primary open-angle glaucoma, bilateral, mild stage: Secondary | ICD-10-CM | POA: Diagnosis not present

## 2024-01-10 ENCOUNTER — Encounter (HOSPITAL_BASED_OUTPATIENT_CLINIC_OR_DEPARTMENT_OTHER): Payer: Self-pay | Admitting: Emergency Medicine

## 2024-01-10 ENCOUNTER — Emergency Department (HOSPITAL_BASED_OUTPATIENT_CLINIC_OR_DEPARTMENT_OTHER)

## 2024-01-10 ENCOUNTER — Emergency Department (HOSPITAL_BASED_OUTPATIENT_CLINIC_OR_DEPARTMENT_OTHER)
Admission: EM | Admit: 2024-01-10 | Discharge: 2024-01-10 | Disposition: A | Discharge status: Home / Self Care | Attending: Emergency Medicine | Admitting: Emergency Medicine

## 2024-01-10 ENCOUNTER — Other Ambulatory Visit: Payer: Self-pay

## 2024-01-10 DIAGNOSIS — Z7901 Long term (current) use of anticoagulants: Secondary | ICD-10-CM | POA: Diagnosis not present

## 2024-01-10 DIAGNOSIS — R7989 Other specified abnormal findings of blood chemistry: Secondary | ICD-10-CM

## 2024-01-10 DIAGNOSIS — E119 Type 2 diabetes mellitus without complications: Secondary | ICD-10-CM | POA: Insufficient documentation

## 2024-01-10 DIAGNOSIS — I4891 Unspecified atrial fibrillation: Secondary | ICD-10-CM | POA: Insufficient documentation

## 2024-01-10 DIAGNOSIS — Z79899 Other long term (current) drug therapy: Secondary | ICD-10-CM | POA: Diagnosis not present

## 2024-01-10 DIAGNOSIS — Z7982 Long term (current) use of aspirin: Secondary | ICD-10-CM | POA: Insufficient documentation

## 2024-01-10 DIAGNOSIS — Z7984 Long term (current) use of oral hypoglycemic drugs: Secondary | ICD-10-CM | POA: Diagnosis not present

## 2024-01-10 DIAGNOSIS — I10 Essential (primary) hypertension: Secondary | ICD-10-CM | POA: Insufficient documentation

## 2024-01-10 DIAGNOSIS — R946 Abnormal results of thyroid function studies: Secondary | ICD-10-CM | POA: Diagnosis not present

## 2024-01-10 DIAGNOSIS — I2510 Atherosclerotic heart disease of native coronary artery without angina pectoris: Secondary | ICD-10-CM | POA: Insufficient documentation

## 2024-01-10 DIAGNOSIS — R002 Palpitations: Secondary | ICD-10-CM | POA: Diagnosis present

## 2024-01-10 LAB — MAGNESIUM: Magnesium: 1.9 mg/dL (ref 1.7–2.4)

## 2024-01-10 LAB — BASIC METABOLIC PANEL WITH GFR
Anion gap: 14 (ref 5–15)
BUN: 23 mg/dL (ref 8–23)
CO2: 21 mmol/L — ABNORMAL LOW (ref 22–32)
Calcium: 8.9 mg/dL (ref 8.9–10.3)
Chloride: 105 mmol/L (ref 98–111)
Creatinine, Ser: 0.84 mg/dL (ref 0.61–1.24)
GFR, Estimated: >60 mL/min (ref >60)
Glucose, Bld: 119 mg/dL — ABNORMAL HIGH (ref 70–99)
Potassium: 4.5 mmol/L (ref 3.5–5.1)
Sodium: 139 mmol/L (ref 135–145)

## 2024-01-10 LAB — COMPREHENSIVE METABOLIC PANEL WITH GFR
ALT: 97 U/L — ABNORMAL HIGH (ref 0–44)
AST: 55 U/L — ABNORMAL HIGH (ref 15–41)
Albumin: 4.3 g/dL (ref 3.5–5.0)
Alkaline Phosphatase: 133 U/L — ABNORMAL HIGH (ref 38–126)
Anion gap: 17 — ABNORMAL HIGH (ref 5–15)
BUN: 23 mg/dL (ref 8–23)
CO2: 20 mmol/L — ABNORMAL LOW (ref 22–32)
Calcium: 9.8 mg/dL (ref 8.9–10.3)
Chloride: 101 mmol/L (ref 98–111)
Creatinine, Ser: 0.95 mg/dL (ref 0.61–1.24)
GFR, Estimated: >60 mL/min (ref >60)
Glucose, Bld: 157 mg/dL — ABNORMAL HIGH (ref 70–99)
Potassium: 4.5 mmol/L (ref 3.5–5.1)
Sodium: 138 mmol/L (ref 135–145)
Total Bilirubin: 0.3 mg/dL (ref 0.0–1.2)
Total Protein: 7.6 g/dL (ref 6.5–8.1)

## 2024-01-10 LAB — CBC WITH DIFFERENTIAL/PLATELET
Abs Immature Granulocytes: 0.02 10*3/uL (ref 0.00–0.07)
Basophils Absolute: 0.1 10*3/uL (ref 0.0–0.1)
Basophils Relative: 2 %
Eosinophils Absolute: 0.3 10*3/uL (ref 0.0–0.5)
Eosinophils Relative: 4 %
HCT: 40.3 % (ref 39.0–52.0)
Hemoglobin: 13 g/dL (ref 13.0–17.0)
Immature Granulocytes: 0 %
Lymphocytes Relative: 29 %
Lymphs Abs: 2 10*3/uL (ref 0.7–4.0)
MCH: 26 pg (ref 26.0–34.0)
MCHC: 32.3 g/dL (ref 30.0–36.0)
MCV: 80.6 fL (ref 80.0–100.0)
Monocytes Absolute: 0.9 10*3/uL (ref 0.1–1.0)
Monocytes Relative: 13 %
Neutro Abs: 3.5 10*3/uL (ref 1.7–7.7)
Neutrophils Relative %: 52 %
Platelets: 175 10*3/uL (ref 150–400)
RBC: 5 MIL/uL (ref 4.22–5.81)
RDW: 16.3 % — ABNORMAL HIGH (ref 11.5–15.5)
WBC: 6.7 10*3/uL (ref 4.0–10.5)
nRBC: 0 % (ref 0.0–0.2)

## 2024-01-10 LAB — TSH: TSH: 5.33 u[IU]/mL — ABNORMAL HIGH (ref 0.350–4.500)

## 2024-01-10 LAB — D-DIMER, QUANTITATIVE: D-Dimer, Quant: 0.44 ug{FEU}/mL (ref 0.00–0.50)

## 2024-01-10 LAB — TROPONIN T, HIGH SENSITIVITY
Troponin T High Sensitivity: 30 ng/L — ABNORMAL HIGH (ref <19)
Troponin T High Sensitivity: 18 ng/L (ref <19)

## 2024-01-10 LAB — T4, FREE: Free T4: 0.75 ng/dL (ref 0.61–1.12)

## 2024-01-10 MED ORDER — METOPROLOL TARTRATE 5 MG/5ML IV SOLN
5.0000 mg | Freq: Once | INTRAVENOUS | Status: AC
  Administered 2024-01-10: 5 mg via INTRAVENOUS
  Filled 2024-01-10: qty 5

## 2024-01-10 MED ORDER — CARVEDILOL 6.25 MG PO TABS: 6.2500 mg | ORAL_TABLET | Freq: Two times a day (BID) | ORAL | Status: DC

## 2024-01-10 MED ORDER — SODIUM CHLORIDE 0.9 % IV BOLUS
1000.0000 mL | Freq: Once | INTRAVENOUS | Status: AC
  Administered 2024-01-10: 1000 mL via INTRAVENOUS

## 2024-01-10 MED ORDER — ETOMIDATE 2 MG/ML IV SOLN
15.0000 mg | Freq: Once | INTRAVENOUS | Status: AC
  Administered 2024-01-10: 15 mg via INTRAVENOUS
  Filled 2024-01-10: qty 10

## 2024-01-10 MED ORDER — ONDANSETRON HCL 4 MG/2ML IJ SOLN
4.0000 mg | Freq: Once | INTRAMUSCULAR | Status: AC
  Administered 2024-01-10: 4 mg via INTRAVENOUS
  Filled 2024-01-10: qty 2

## 2024-01-10 MED ORDER — APIXABAN 5 MG PO TABS: 5.0000 mg | ORAL_TABLET | Freq: Two times a day (BID) | ORAL | 0 refills | Status: DC

## 2024-01-10 MED ORDER — FENTANYL CITRATE PF 50 MCG/ML IJ SOSY
PREFILLED_SYRINGE | INTRAMUSCULAR | Status: AC
  Administered 2024-01-10: 25 ug
  Filled 2024-01-10: qty 1

## 2024-01-10 NOTE — ED Notes (Signed)
 RT was present for conscious sedation. ETCO2 cannula was placed on the patient at 2 lpm, suction was setup, and ambu was at the bedside. Pt tolerated well his O2 saturation remained 96-100% during the procedure.

## 2024-01-10 NOTE — Sedation Documentation (Signed)
Unable to rate pain during procedure 

## 2024-01-10 NOTE — Sedation Documentation (Addendum)
 Synchronized cardioversion delivered by EDP @ 120j

## 2024-01-10 NOTE — ED Provider Notes (Signed)
 [facility] EMERGENCY DEPARTMENT AT [facility] Provider Note   CSN: [id number] Arrival date & time: 01/10/24  2952     History  Chief Complaint  Patient presents with   Palpitations    [name] is a 73 y.o. male.   Palpitations    73 year old male with medical history significant for CAD, obesity, HLD, HTN on Coreg, amlodipine  and valsartan-hydrochlorothiazide, diabetes mellitus presenting to the emergency department with a chief complaint of heart palpitations that came on last night.  The patient states that he has had no chest pain or shortness of breath.  He denies any cough, fever or chills.  Last night he developed sudden onset heart palpitations and noticed his heart rate was elevated in the 110s.  Symptoms persisted through the morning and prompted his presentation to the emergency department.  He does not drink alcohol, denies any history of thyroid dysfunction, currently is without chest pain or shortness of breath.  He continues to endorse a sensation of heart palpitations.  He denies any history of atrial fibrillation. He denies any lower extremity swelling.  Home Medications Prior to Admission medications   Medication Sig Start Date End Date Taking? Authorizing Provider  apixaban (ELIQUIS) 5 MG TABS tablet Take 1 tablet (5 mg total) by mouth 2 (two) times daily. 01/10/24 02/09/24 Yes Rosealee Concha, MD  colchicine 0.6 MG tablet Take by mouth. 11/16/23  Yes [provider]  amLODipine  (NORVASC ) 5 MG tablet Take 1 tablet (5 mg total) by mouth daily. 09/05/11   Lenise Quince, MD  aspirin 81 MG tablet Take 81 mg by mouth daily.      [provider]  carvedilol (COREG) 6.25 MG tablet Take 6.25 mg by mouth 2 (two) times daily.    [provider]  fish oil-omega-3 fatty acids 1000 MG capsule Take by mouth daily.      [provider]  irbesartan (AVAPRO) 300 MG tablet Take 300 mg by mouth daily.    [provider]   JARDIANCE 10 MG TABS tablet Take 10 mg by mouth daily.    [provider]  LUMIGAN 0.01 % SOLN USE 1 DROP IN BOTH EYES AT BEDTIME 11/11/18   [provider]  metFORMIN (GLUCOPHAGE) 1000 MG tablet Take 1,000 mg by mouth 2 (two) times daily with a meal.      [provider]  Multiple Vitamin (MULTIVITAMIN) tablet Take 1 tablet by mouth daily.      [provider]  omeprazole (PRILOSEC) 20 MG capsule TAKE 2 CAPSULES EVERY DAY IF NEEDED 11/12/18   [provider]  [facility] VERIO test strip USE AS DIRECTED ONCE A DAY 11/30/18   [provider]  rosuvastatin (CRESTOR) 5 MG tablet Take 5 mg by mouth 3 (three) times a week.      [provider]  sertraline (ZOLOFT) 50 MG tablet Take 50 mg by mouth daily. 01/14/19   [provider]  valsartan-hydrochlorothiazide (DIOVAN-HCT) 320-25 MG per tablet Take 1 tablet by mouth daily.      [provider]      Allergies    Xylocaine [lidocaine], Penicillins, and Prednisone    Review of Systems   Review of Systems  Cardiovascular:  Positive for palpitations.  All other systems reviewed and are negative.   Physical Exam Updated Vital Signs BP (!) 111/50   Pulse (!) 57   Temp 97.9 F (36.6 C) (Oral)   Resp (!) 7   SpO2 97%  Physical Exam  Vitals and nursing note reviewed.  Constitutional:      General: He is not in acute distress.    Appearance: He is well-developed.  HENT:     Head: Normocephalic and atraumatic.  Eyes:     Conjunctiva/sclera: Conjunctivae normal.  Cardiovascular:     Rate and Rhythm: Tachycardia present. Rhythm irregular.  Pulmonary:     Effort: Pulmonary effort is normal. No respiratory distress.     Breath sounds: Normal breath sounds.  Abdominal:     Palpations: Abdomen is soft.     Tenderness: There is no abdominal tenderness.  Musculoskeletal:        General: No swelling.     Cervical back: Neck supple.  Skin:    General: Skin is warm and  dry.     Capillary Refill: Capillary refill takes less than 2 seconds.  Neurological:     Mental Status: He is alert.  Psychiatric:        Mood and Affect: Mood normal.     ED Results / Procedures / Treatments   Labs (all labs ordered are listed, but only abnormal results are displayed) Labs Reviewed  CBC WITH DIFFERENTIAL/PLATELET - Abnormal; Notable for the following components:      Result Value   RDW 16.3 (*)    All other components within normal limits  COMPREHENSIVE METABOLIC PANEL WITH GFR - Abnormal; Notable for the following components:   CO2 20 (*)    Glucose, Bld 157 (*)    AST 55 (*)    ALT 97 (*)    Alkaline Phosphatase 133 (*)    Anion gap 17 (*)    All other components within normal limits  TSH - Abnormal; Notable for the following components:   TSH 5.330 (*)    All other components within normal limits  BASIC METABOLIC PANEL WITH GFR - Abnormal; Notable for the following components:   CO2 21 (*)    Glucose, Bld 119 (*)    All other components within normal limits  TROPONIN T, HIGH SENSITIVITY - Abnormal; Notable for the following components:   Troponin T High Sensitivity 30 (*)    All other components within normal limits  MAGNESIUM  D-DIMER, QUANTITATIVE  T4, FREE  TROPONIN T, HIGH SENSITIVITY    EKG EKG Interpretation Date/Time:  Wednesday Jan 10 2024 10:34:54 EDT Ventricular Rate:  60 PR Interval:  177 QRS Duration:  90 QT Interval:  377 QTC Calculation: 377 R Axis:   49  Text Interpretation: Sinus rhythm Minimal ST elevation, anterior leads No STEMI Reconfirmed by Rosealee Concha ([id number]) on 01/10/2024 11:29:39 AM  Radiology DG Chest Portable 1 View Result Date: 01/10/2024 CLINICAL DATA:  New onset atrial fibrillation EXAM: PORTABLE CHEST 1 VIEW COMPARISON:  None Available. FINDINGS: No consolidation, pneumothorax or effusion. Normal cardiopericardial silhouette without edema. Overlapping cardiac leads. IMPRESSION: No acute cardiopulmonary  disease. Electronically Signed   By: Adrianna Horde M.D.   On: 01/10/2024 10:25    Procedures .Cardioversion  Date/Time: 01/10/2024 10:41 AM  Performed by: Rosealee Concha, MD Authorized by: Rosealee Concha, MD   Consent:    Consent obtained:  Written   Consent given by:  Patient   Risks discussed:  Induced arrhythmia, pain, death and cutaneous burn   Alternatives discussed:  Rate-control medication and anti-coagulation medication Pre-procedure details:    Cardioversion basis:  Emergent   Rhythm:  Atrial fibrillation   Electrode placement:  Anterior-posterior Patient sedated: Yes. Refer to sedation procedure documentation for details of sedation.  Attempt one:    Cardioversion mode:  Synchronous   Waveform:  Biphasic   Shock (Joules):  120   Shock outcome:  Conversion to normal sinus rhythm Post-procedure details:    Patient status:  Awake   Patient tolerance of procedure:  Tolerated well, no immediate complications .Sedation  Date/Time: 01/10/2024 10:32 AM  Performed by: Rosealee Concha, MD Authorized by: Rosealee Concha, MD   Consent:    Consent obtained:  Written   Consent given by:  Patient   Risks discussed:  Allergic reaction, dysrhythmia, inadequate sedation, nausea, vomiting, respiratory compromise necessitating ventilatory assistance and intubation and prolonged hypoxia resulting in organ damage Universal protocol:    Immediately prior to procedure, a time out was called: yes     Patient identity confirmed:  Verbally with patient and arm band Indications:    Procedure performed:  Cardioversion   Procedure necessitating sedation performed by:  Physician performing sedation Pre-sedation assessment:    Time since last food or drink:  4hrs   ASA classification: class 2 - patient with mild systemic disease     Mouth opening:  3 or more finger widths   Thyromental distance:  3 finger widths   Mallampati score:  I - soft palate, uvula, fauces, pillars visible   Neck  mobility: normal     Pre-sedation assessments completed and reviewed: airway patency, cardiovascular function, hydration status, mental status, nausea/vomiting, pain level, respiratory function and temperature   A pre-sedation assessment was completed prior to the start of the procedure Immediate pre-procedure details:    Reassessment: Patient reassessed immediately prior to procedure     Reviewed: vital signs, relevant labs/tests and NPO status     Verified: bag valve mask available, emergency equipment available, intubation equipment available, IV patency confirmed, oxygen available, reversal medications available and suction available   Procedure details (see MAR for exact dosages):    Preoxygenation:  Nasal cannula   Sedation:  Etomidate   Intended level of sedation: deep   Analgesia:  Fentanyl   Intra-procedure monitoring:  Blood pressure monitoring, cardiac monitor, continuous capnometry, continuous pulse oximetry, frequent LOC assessments and frequent vital sign checks   Intra-procedure events: none     Total Provider sedation time (minutes):  10 Post-procedure details:   A post-sedation assessment was completed following the completion of the procedure.   Attendance: Constant attendance by certified staff until patient recovered     Recovery: Patient returned to pre-procedure baseline     Post-sedation assessments completed and reviewed: airway patency, cardiovascular function, hydration status, mental status, nausea/vomiting, pain level, respiratory function and temperature     Patient is stable for discharge or admission: yes     Procedure completion:  Tolerated well, no immediate complications     Medications Ordered in ED Medications  sodium chloride 0.9 % bolus 1,000 mL (0 mLs Intravenous Stopped 01/10/24 1047)  metoprolol tartrate (LOPRESSOR) injection 5 mg (5 mg Intravenous Given 01/10/24 0946)  etomidate (AMIDATE) injection 15 mg (15 mg Intravenous Given 01/10/24 1029)   ondansetron (ZOFRAN) injection 4 mg (4 mg Intravenous Given 01/10/24 1023)  fentaNYL (SUBLIMAZE) 50 MCG/ML injection (25 mcg  Given 01/10/24 1028)    ED Course/ Medical Decision Making/ A&P         CHA2DS2-VASc Score: 3                        Medical Decision Making Amount and/or Complexity of Data Reviewed Labs: ordered. Radiology: ordered. ECG/medicine tests: ordered.  Risk Prescription drug management.    73 year old male with medical history significant for CAD, obesity, HLD, HTN on Coreg, amlodipine  and valsartan-hydrochlorothiazide, diabetes mellitus presenting to the emergency department with a chief complaint of heart palpitations that came on last night.  The patient states that he has had no chest pain or shortness of breath.  He denies any cough, fever or chills.  Last night he developed sudden onset heart palpitations and noticed his heart rate was elevated in the 110s.  Symptoms persisted through the morning and prompted his presentation to the emergency department.  He does not drink alcohol, denies any history of thyroid dysfunction, currently is without chest pain or shortness of breath.  He continues to endorse a sensation of heart palpitations.  He denies any history of atrial fibrillation. He denies any lower extremity swelling.  On arrival, the patient is afebrile, not tachycardic or tachypneic, BP 102/49, saturating 98% on room air.  On cardiac telemetry the patient was in new onset atrial fibrillation without RVR.  This was confirmed on EKG, ventricular rate 9 0, atrial fibrillation, nonspecific ST changes.  Patient without chest pain or shortness of breath.  Initial cardiac troponin 30, suspect likely rate related in the setting the patient's elevated heart rate last night, patient asymptomatic, low concern for ACS.  D-dimer negative, low concern for PE, magnesium normal, potassium normal, renal function normal, LFTs mildly elevated AST 55, ALT 97, CBC without a  leukocytosis or anemia, TSH mildly elevated at 5.33, free T4 pending.  I consulted on-call cardiology given the patient's medical comorbidities and age in the setting of new onset atrial fibrillation.  He acutely felt onset of symptoms last night.  Spoke with Dr. Abel Hoe regarding the patient's presentation, agreed with the plan for cardioversion in the emergency department and close outpatient follow-up in the atrial fibrillation clinic.  Patient is chest pain free, has been throughout his entire ER course and had no chest pain last night.  Low concern for ACS.  Mildly elevated troponin likely demand in the setting of the patient's elevated heart rate last night.  The patient was consented for procedural sedation for cardioversion and this was subsequently performed with 120 J synchronized cardioversion and successful conversion to normal sinus rhythm.  The patient was monitored post sedation with no adverse events noted.  He remains chest pain-free and is well-appearing on repeat assessment, vitally stable.  He had a mild anion gap acidosis on his initial CMP, after fluid resuscitation his repeat BMP revealed improvement and gap closure. Repeat trop normal. He is already on Coreg outpatient, prescription for Eliquis was prescribed.  The patient was advised to stop his aspirin as it is unclear what this was initially prescribed for and would increase his bleeding risk slightly.  He was advised to follow-up closely with the atrial fibrillation clinic, return precautions provided.  Final Clinical Impression(s) / ED Diagnoses Final diagnoses:  New onset atrial fibrillation (HCC)  Elevated TSH    Rx / DC Orders ED Discharge Orders          Ordered    apixaban (ELIQUIS) 5 MG TABS tablet  2 times daily        01/10/24 0934    Amb Referral to AFIB Clinic        01/10/24 0934              Rosealee Concha, MD 01/10/24 1231

## 2024-01-10 NOTE — ED Triage Notes (Signed)
 C/o palpitations and "racing heart" since last night. No hx. Mild SHOB.

## 2024-01-10 NOTE — Discharge Instructions (Addendum)
 You were found to be in new onset atrial fibrillation.  Your TSH was mildly elevated, free T4 is pending, follow-up with your primary care doctor regarding this finding.  Recommend you stop your baby aspirin, start Eliquis today, follow-up with the atrial fibrillation clinic.  Return for any uncontrolled heart rate, new chest pain, shortness of breath, leg swelling or any other concerns.

## 2024-01-21 DIAGNOSIS — R059 Cough, unspecified: Secondary | ICD-10-CM | POA: Diagnosis not present

## 2024-01-21 DIAGNOSIS — R0602 Shortness of breath: Secondary | ICD-10-CM | POA: Diagnosis not present

## 2024-01-21 DIAGNOSIS — E785 Hyperlipidemia, unspecified: Secondary | ICD-10-CM | POA: Diagnosis not present

## 2024-01-21 DIAGNOSIS — I10 Essential (primary) hypertension: Secondary | ICD-10-CM | POA: Diagnosis not present

## 2024-01-21 DIAGNOSIS — E119 Type 2 diabetes mellitus without complications: Secondary | ICD-10-CM | POA: Diagnosis not present

## 2024-01-21 DIAGNOSIS — Z7901 Long term (current) use of anticoagulants: Secondary | ICD-10-CM | POA: Diagnosis not present

## 2024-01-21 DIAGNOSIS — I4891 Unspecified atrial fibrillation: Secondary | ICD-10-CM | POA: Diagnosis not present

## 2024-01-26 ENCOUNTER — Ambulatory Visit (HOSPITAL_COMMUNITY)
Admission: RE | Admit: 2024-01-26 | Discharge: 2024-01-26 | Disposition: A | Discharge status: Home / Self Care | Source: Ambulatory Visit | Attending: Internal Medicine | Admitting: Internal Medicine

## 2024-01-26 ENCOUNTER — Other Ambulatory Visit (HOSPITAL_COMMUNITY): Payer: Self-pay

## 2024-01-26 VITALS — BP 142/60 | HR 63 | Ht 71.0 in | Wt 248.0 lb

## 2024-01-26 DIAGNOSIS — I4891 Unspecified atrial fibrillation: Secondary | ICD-10-CM

## 2024-01-26 DIAGNOSIS — I480 Paroxysmal atrial fibrillation: Secondary | ICD-10-CM | POA: Diagnosis not present

## 2024-01-26 DIAGNOSIS — D6869 Other thrombophilia: Secondary | ICD-10-CM | POA: Insufficient documentation

## 2024-01-26 MED ORDER — APIXABAN 5 MG PO TABS: 5.0000 mg | ORAL_TABLET | Freq: Two times a day (BID) | ORAL | 3 refills | Status: DC

## 2024-01-26 NOTE — Progress Notes (Addendum)
 Primary Care Physician: Lanae Pinal, MD Primary Cardiologist: None Electrophysiologist: None     Referring Physician: ED     [name] is a 73 y.o. male with a history of CAD, obesity, HLD, OSA on CPAP, HTN, T2DM, and atrial fibrillation who presents for consultation in the [facility]. ED visit on 5/28 for new onset Afib s/p successful DCCV. Patient is on Eliquis  for a CHADS2VASC score of 3.  On evaluation today, he is currently in NSR. He rarely drinks alcohol. He has used CPAP for ~30 years. He drinks 1-2 cups of coffee daily. He did not sleep well the night before ED visit and wife put her Apple watch on patient noting new Afib.   Today, he denies symptoms of palpitations, chest pain, shortness of breath, orthopnea, PND, lower extremity edema, dizziness, presyncope, syncope, snoring, daytime somnolence, bleeding, or neurologic sequela. The patient is tolerating medications without difficulties and is otherwise without complaint today.    Atrial Fibrillation Risk Factors:  he does have symptoms or diagnosis of sleep apnea. he is compliant with CPAP therapy.  he has a BMI of Body mass index is 34.59 kg/m.[name] Filed Weights   01/26/24 1029  Weight: 112.5 kg    Current Outpatient Medications  Medication Sig Dispense Refill   amLODipine  (NORVASC ) 5 MG tablet Take 1 tablet (5 mg total) by mouth daily. 90 tablet 3   carvedilol  (COREG ) 6.25 MG tablet Take 6.25 mg by mouth 2 (two) times daily.     colchicine 0.6 MG tablet Take by mouth. (Patient taking differently: Take 0.6 mg by mouth daily.)     fish oil-omega-3 fatty acids 1000 MG capsule Take by mouth daily.   (Patient taking differently: Take 1 g by mouth daily.)     irbesartan (AVAPRO) 300 MG tablet Take 300 mg by mouth daily.     JARDIANCE 10 MG TABS tablet Take 10 mg by mouth daily.     LUMIGAN 0.01 % SOLN USE 1 DROP IN BOTH EYES AT BEDTIME     metFORMIN (GLUCOPHAGE) 1000 MG tablet Take  1,000 mg by mouth 2 (two) times daily with a meal.       Multiple Vitamin (MULTIVITAMIN) tablet Take 1 tablet by mouth daily.       omeprazole (PRILOSEC) 20 MG capsule TAKE 2 CAPSULES EVERY DAY IF NEEDED (Patient taking differently: Take 40 mg by mouth as needed.)     ONETOUCH VERIO test strip USE AS DIRECTED ONCE A DAY     rosuvastatin (CRESTOR) 5 MG tablet Take 5 mg by mouth 3 (three) times a week.       sertraline (ZOLOFT) 50 MG tablet Take 50 mg by mouth daily.     valsartan-hydrochlorothiazide (DIOVAN-HCT) 320-25 MG per tablet Take 1 tablet by mouth daily.       apixaban  (ELIQUIS ) 5 MG TABS tablet Take 1 tablet (5 mg total) by mouth 2 (two) times daily. 60 tablet 3   No current facility-administered medications for this encounter.    Atrial Fibrillation Management history:  Previous antiarrhythmic drugs: none Previous cardioversions: 5/28 (ED) Previous ablations: none Anticoagulation history: Eliquis    ROS- All systems are reviewed and negative except as per the HPI above.  Physical Exam: BP (!) 142/60   Pulse 63   Ht 5' 11 (1.803 m)   Wt 112.5 kg   BMI 34.59 kg/m   GEN: Well nourished, well developed in no acute distress NECK: No JVD; No carotid bruits CARDIAC:  Regular rate and rhythm, no murmurs, rubs, gallops RESPIRATORY:  Clear to auscultation without rales, wheezing or rhonchi  ABDOMEN: Soft, non-tender, non-distended EXTREMITIES:  No edema; No deformity   EKG today demonstrates  Vent. rate 63 BPM PR interval 170 ms QRS duration 96 ms QT/QTcB 384/392 ms P-R-T axes 81 62 70 Normal sinus rhythm Nonspecific ST abnormality Abnormal ECG When compared with ECG of 10-Jan-2024 10:34, PREVIOUS ECG IS PRESENT  Echo N/A  ASSESSMENT & PLAN CHA2DS2-VASc Score = 4  The patient's score is based upon: CHF History: 0 HTN History: 1 Diabetes History: 1 Stroke History: 0 Vascular Disease History: 1 Age Score: 1 Gender Score: 0       ASSESSMENT AND  PLAN: Paroxysmal Atrial Fibrillation (ICD10:  I48.0) The patient's CHA2DS2-VASc score is 4, indicating a 4.8% annual risk of stroke.    He is currently in NSR. Education provided about Afib. Discussion about triggers for Afib and medication treatments and ablation going forward if indicated. After discussion, we will proceed with conservative observation at this time. Rhythm monitoring device recommended. Will order baseline echocardiogram. Patient would like to reestablish care with cardiologist. Due to new Afib and risk factors, this is reasonable.    Secondary Hypercoagulable State (ICD10:  D68.69) The patient is at significant risk for stroke/thromboembolism based upon his CHA2DS2-VASc Score of 4.  Continue Apixaban  (Eliquis ).  Patient already stopped ASA on his own. We discussed risks vs benefits of anticoagulation in the setting of stroke risk due to Afib. Patient wishes to continue on OAC. Continue Eliquis  5 mg BID. Lab order given to draw CBC in 1 month.     Follow up 3 months Afib clinic.   Minnie Amber, PA-C  Afib Clinic [facility] [address] [place], [place] [address] [phone]

## 2024-01-26 NOTE — Patient Instructions (Signed)
 Stop aspirin   Labwork in one month at [facility] attached lab slip   Echocardiogram -- scheduling will call once insurance authorization received.

## 2024-02-23 ENCOUNTER — Ambulatory Visit (HOSPITAL_COMMUNITY)
Admission: RE | Admit: 2024-02-23 | Discharge: 2024-02-23 | Disposition: A | Discharge status: Home / Self Care | Source: Ambulatory Visit | Attending: Vascular Surgery | Admitting: Vascular Surgery

## 2024-02-23 DIAGNOSIS — I4891 Unspecified atrial fibrillation: Secondary | ICD-10-CM | POA: Diagnosis not present

## 2024-02-23 LAB — ECHOCARDIOGRAM COMPLETE
AR max vel: 1.93 cm2
AV Area VTI: 2.03 cm2
AV Area mean vel: 1.91 cm2
AV Mean grad: 6 mmHg
AV Peak grad: 11.7 mmHg
Ao pk vel: 1.71 m/s
Area-P 1/2: 3.42 cm2
S' Lateral: 3.25 cm

## 2024-02-26 ENCOUNTER — Ambulatory Visit (HOSPITAL_COMMUNITY): Payer: Self-pay | Admitting: Internal Medicine

## 2024-03-07 DIAGNOSIS — H90A32 Mixed conductive and sensorineural hearing loss, unilateral, left ear with restricted hearing on the contralateral side: Secondary | ICD-10-CM | POA: Diagnosis not present

## 2024-03-07 DIAGNOSIS — Z9622 Myringotomy tube(s) status: Secondary | ICD-10-CM | POA: Diagnosis not present

## 2024-03-07 DIAGNOSIS — H6122 Impacted cerumen, left ear: Secondary | ICD-10-CM | POA: Diagnosis not present

## 2024-03-07 DIAGNOSIS — H9211 Otorrhea, right ear: Secondary | ICD-10-CM | POA: Diagnosis not present

## 2024-03-27 DIAGNOSIS — Z9622 Myringotomy tube(s) status: Secondary | ICD-10-CM | POA: Diagnosis not present

## 2024-03-27 DIAGNOSIS — H9211 Otorrhea, right ear: Secondary | ICD-10-CM | POA: Diagnosis not present

## 2024-04-16 DIAGNOSIS — H9211 Otorrhea, right ear: Secondary | ICD-10-CM | POA: Diagnosis not present

## 2024-04-16 DIAGNOSIS — H9191 Unspecified hearing loss, right ear: Secondary | ICD-10-CM | POA: Diagnosis not present

## 2024-04-16 DIAGNOSIS — Z9622 Myringotomy tube(s) status: Secondary | ICD-10-CM | POA: Diagnosis not present

## 2024-04-22 DIAGNOSIS — M1611 Unilateral primary osteoarthritis, right hip: Secondary | ICD-10-CM | POA: Diagnosis not present

## 2024-04-26 DIAGNOSIS — H401131 Primary open-angle glaucoma, bilateral, mild stage: Secondary | ICD-10-CM | POA: Diagnosis not present

## 2024-04-26 DIAGNOSIS — H2513 Age-related nuclear cataract, bilateral: Secondary | ICD-10-CM | POA: Diagnosis not present

## 2024-05-13 ENCOUNTER — Ambulatory Visit (HOSPITAL_COMMUNITY): Admitting: Internal Medicine

## 2024-05-21 DIAGNOSIS — Z23 Encounter for immunization: Secondary | ICD-10-CM | POA: Diagnosis not present

## 2024-05-23 ENCOUNTER — Encounter (HOSPITAL_COMMUNITY): Payer: Self-pay | Admitting: Internal Medicine

## 2024-05-23 ENCOUNTER — Ambulatory Visit (HOSPITAL_COMMUNITY)
Admission: RE | Admit: 2024-05-23 | Discharge: 2024-05-23 | Disposition: A | Discharge status: Home / Self Care | Source: Ambulatory Visit | Attending: Internal Medicine | Admitting: Internal Medicine

## 2024-05-23 VITALS — BP 110/60 | HR 64 | Ht 71.0 in | Wt 249.4 lb

## 2024-05-23 DIAGNOSIS — I480 Paroxysmal atrial fibrillation: Secondary | ICD-10-CM | POA: Diagnosis not present

## 2024-05-23 DIAGNOSIS — D6869 Other thrombophilia: Secondary | ICD-10-CM | POA: Insufficient documentation

## 2024-05-23 MED ORDER — APIXABAN 5 MG PO TABS: 5.0000 mg | ORAL_TABLET | Freq: Two times a day (BID) | ORAL | 6 refills | Status: AC

## 2024-05-23 NOTE — Progress Notes (Signed)
 Primary Care Physician: Regino Slater, MD Primary Cardiologist: None Electrophysiologist: None     Referring Physician: ED     [name] is a 73 y.o. male with a history of CAD, obesity, HLD, OSA on CPAP, HTN, T2DM, and atrial fibrillation who presents for consultation in the [facility]. ED visit on 5/28 for new onset Afib s/p successful DCCV. Patient is on Eliquis  for a CHADS2VASC score of 3.  On evaluation today, he is currently in NSR. He rarely drinks alcohol. He has used CPAP for ~30 years. He drinks 1-2 cups of coffee daily. He did not sleep well the night before ED visit and wife put her Apple watch on patient noting new Afib.   On follow up 05/23/24, patient is currently in NSR. He has noted overall no Afib burden since last office visit. He wears an Apple watch for monitoring rhythm. No bleeding issues on Eliquis .  Today, he denies symptoms of palpitations, chest pain, shortness of breath, orthopnea, PND, lower extremity edema, dizziness, presyncope, syncope, snoring, daytime somnolence, bleeding, or neurologic sequela. The patient is tolerating medications without difficulties and is otherwise without complaint today.    Atrial Fibrillation Risk Factors:  he does have symptoms or diagnosis of sleep apnea. he is compliant with CPAP therapy.  he has a BMI of Body mass index is 34.78 kg/m.SABRA Filed Weights   05/23/24 1031  Weight: 113.1 kg     Current Outpatient Medications  Medication Sig Dispense Refill   amLODipine  (NORVASC ) 5 MG tablet Take 1 tablet (5 mg total) by mouth daily. 90 tablet 3   apixaban  (ELIQUIS ) 5 MG TABS tablet Take 1 tablet (5 mg total) by mouth 2 (two) times daily. 60 tablet 3   carvedilol  (COREG ) 6.25 MG tablet Take 6.25 mg by mouth 2 (two) times daily.     colchicine 0.6 MG tablet Take by mouth. (Patient taking differently: Take 0.6 mg by mouth daily.)     fish oil-omega-3 fatty acids 1000 MG capsule Take by mouth  daily.   (Patient taking differently: Take 1 g by mouth daily.)     irbesartan (AVAPRO) 300 MG tablet Take 300 mg by mouth daily.     JARDIANCE 10 MG TABS tablet Take 10 mg by mouth daily.     LUMIGAN 0.01 % SOLN USE 1 DROP IN BOTH EYES AT BEDTIME     metFORMIN (GLUCOPHAGE) 1000 MG tablet Take 1,000 mg by mouth 2 (two) times daily with a meal.       Multiple Vitamin (MULTIVITAMIN) tablet Take 1 tablet by mouth daily.       omeprazole (PRILOSEC) 20 MG capsule TAKE 2 CAPSULES EVERY DAY IF NEEDED (Patient taking differently: Take 40 mg by mouth as needed.)     ONETOUCH VERIO test strip USE AS DIRECTED ONCE A DAY     rosuvastatin (CRESTOR) 5 MG tablet Take 5 mg by mouth 3 (three) times a week.       sertraline (ZOLOFT) 50 MG tablet Take 50 mg by mouth daily.     No current facility-administered medications for this encounter.    Atrial Fibrillation Management history:  Previous antiarrhythmic drugs: none Previous cardioversions: 5/28 (ED) Previous ablations: none Anticoagulation history: Eliquis    ROS- All systems are reviewed and negative except as per the HPI above.  Physical Exam: BP 110/60   Pulse 64   Ht 5' 11 (1.803 m)   Wt 113.1 kg   BMI 34.78 kg/m  GEN- The patient is well appearing, alert and oriented x 3 today.   Neck - no JVD or carotid bruit noted Lungs- Clear to ausculation bilaterally, normal work of breathing Heart- Regular rate and rhythm, no murmurs, rubs or gallops, PMI not laterally displaced Extremities- no clubbing, cyanosis, or edema Skin - no rash or ecchymosis noted   EKG today demonstrates  Vent. rate 64 BPM PR interval 178 ms QRS duration 94 ms QT/QTcB 372/383 ms P-R-T axes 68 42 28 Normal sinus rhythm Nonspecific ST and T wave abnormality Abnormal ECG When compared with ECG of 26-Jan-2024 10:44, Previous ECG is present  Echo 02/23/24: 1. Left ventricular ejection fraction, by estimation, is 60 to 65%. The  left ventricle has normal  function. The left ventricle has no regional  wall motion abnormalities. Left ventricular diastolic parameters were  normal.   2. Right ventricular systolic function is normal. The right ventricular  size is normal.   3. Mild mitral valve regurgitation.   4. The aortic valve is tricuspid. Aortic valve regurgitation is not  visualized.   5. The inferior vena cava is normal in size with greater than 50%  respiratory variability, suggesting right atrial pressure of 3 mmHg.    ASSESSMENT & PLAN CHA2DS2-VASc Score = 4  The patient's score is based upon: CHF History: 0 HTN History: 1 Diabetes History: 1 Stroke History: 0 Vascular Disease History: 1 Age Score: 1 Gender Score: 0       ASSESSMENT AND PLAN: Paroxysmal Atrial Fibrillation (ICD10:  I48.0) The patient's CHA2DS2-VASc score is 4, indicating a 4.8% annual risk of stroke.    Patient is currently in NSR. He is overall stable from an Afib standpoint so will not make any changes to current regimen. He would like to establish with cardiology and has had trouble finding one. Will refer to help patient.   Secondary Hypercoagulable State (ICD10:  D68.69) The patient is at significant risk for stroke/thromboembolism based upon his CHA2DS2-VASc Score of 4.  Continue Apixaban  (Eliquis ).  Continue Eliquis . CBC today to recheck since starting OAC.    Referral to help establish with cardiologist per patient request. Follow up 6 months Afib clinic.   Terra Pac, PA-C  [facility] [address] [place], [place] [address] [phone]

## 2024-05-24 ENCOUNTER — Ambulatory Visit (HOSPITAL_COMMUNITY): Payer: Self-pay | Admitting: Internal Medicine

## 2024-05-24 LAB — CBC
Hematocrit: 43.7 % (ref 37.5–51.0)
Hemoglobin: 13.7 g/dL (ref 13.0–17.7)
MCH: 27 pg (ref 26.6–33.0)
MCHC: 31.4 g/dL — ABNORMAL LOW (ref 31.5–35.7)
MCV: 86 fL (ref 79–97)
Platelets: 179 x10E3/uL (ref 150–450)
RBC: 5.07 x10E6/uL (ref 4.14–5.80)
RDW: 16.4 % — ABNORMAL HIGH (ref 11.6–15.4)
WBC: 6 x10E3/uL (ref 3.4–10.8)

## 2024-07-08 DIAGNOSIS — Z9089 Acquired absence of other organs: Secondary | ICD-10-CM | POA: Diagnosis not present

## 2024-07-08 DIAGNOSIS — H6123 Impacted cerumen, bilateral: Secondary | ICD-10-CM | POA: Diagnosis not present

## 2024-07-08 DIAGNOSIS — Z974 Presence of external hearing-aid: Secondary | ICD-10-CM | POA: Diagnosis not present

## 2024-07-08 DIAGNOSIS — Z9622 Myringotomy tube(s) status: Secondary | ICD-10-CM | POA: Diagnosis not present

## 2024-07-20 DIAGNOSIS — L02512 Cutaneous abscess of left hand: Secondary | ICD-10-CM | POA: Diagnosis not present

## 2024-07-20 DIAGNOSIS — L089 Local infection of the skin and subcutaneous tissue, unspecified: Secondary | ICD-10-CM | POA: Diagnosis not present

## 2024-09-06 ENCOUNTER — Ambulatory Visit: Attending: Cardiology | Admitting: Cardiology

## 2024-09-06 VITALS — BP 100/40 | HR 72 | Ht 70.5 in | Wt 248.0 lb

## 2024-09-06 DIAGNOSIS — I10 Essential (primary) hypertension: Secondary | ICD-10-CM | POA: Diagnosis present

## 2024-09-06 DIAGNOSIS — I2510 Atherosclerotic heart disease of native coronary artery without angina pectoris: Secondary | ICD-10-CM | POA: Diagnosis present

## 2024-09-06 DIAGNOSIS — E119 Type 2 diabetes mellitus without complications: Secondary | ICD-10-CM | POA: Insufficient documentation

## 2024-09-06 DIAGNOSIS — I480 Paroxysmal atrial fibrillation: Secondary | ICD-10-CM | POA: Diagnosis present

## 2024-09-06 DIAGNOSIS — G4733 Obstructive sleep apnea (adult) (pediatric): Secondary | ICD-10-CM | POA: Diagnosis present

## 2024-09-06 DIAGNOSIS — D6869 Other thrombophilia: Secondary | ICD-10-CM | POA: Diagnosis present

## 2024-09-06 NOTE — Patient Instructions (Signed)

## 2024-09-06 NOTE — Progress Notes (Signed)
 " Cardiology Office Note:  .   Date:  09/06/2024  ID:  [name], DOB 1950-11-23, MRN [id number] PCP: Regino Slater, MD  [facility] Providers Cardiologist:  Oneil Parchment, MD    History of Present Illness: .   [name] is a 74 y.o. male Discussed the use of AI scribe   History of Present Illness [name] is a 74 year old male with atrial fibrillation who presents for evaluation of his condition.  Atrial fibrillation - Experienced an episode of atrial fibrillation on Jan 07, 2024, characterized by a racing heart with intermittent slowing, lasting for hours overnight. - Atrial fibrillation confirmed by wearable device. - Presented to emergency room with heart rate of 110 bpm. - Underwent successful cardioversion, restoring normal sinus rhythm. - No recurrent episodes of atrial fibrillation since cardioversion. - Currently taking apixaban  5 mg twice daily and carvedilol  6.25 mg twice daily. - No chest pain or other cardiac symptoms since cardioversion.  Coronary artery disease - History of nonobstructive coronary artery disease. - Cardiac catheterization in 2006 showed normal left main and 20-30% stenosis in obtuse marginal and large diagonal arteries. - No personal history of myocardial infarction. - Most recent echocardiogram on February 23, 2024, showed ejection fraction of 65% with mild mitral regurgitation.  Diabetes mellitus - History of diabetes mellitus. - Currently taking Jardiance 10 mg daily and metformin. - Family history of diabetes affecting father, all uncles, and older brother.  Hypertension - History of hypertension. - Currently taking irbesartan 300 mg daily and amlodipine  5 mg daily.  Obstructive sleep apnea - History of obstructive sleep apnea. - Uses CPAP therapy.  Musculoskeletal surgery - Underwent right hip replacement 10-11 months ago. - Doing well post-surgery.  Ophthalmologic history - History of detached retina treated with scleral  buckle.  Retired, last worked for [employer]. Wife [name] here with him.  ER notes reviewed, lab work reviewed, EKGs reviewed, office notes from A-fib clinic reviewed    ROS: No CP, no SOB  Studies Reviewed: .        Results Labs Hemoglobin A1c (Friday September 06, 2024): 7.1  Diagnostic Transthoracic echocardiogram (02/23/2024): Left ventricular ejection fraction 65%, mild mitral regurgitation, normal systolic function Cardiac catheterization (2006): Nonobstructive coronary artery disease; normal left main; 20-30% stenosis in obtuse marginal and large diagonal branches; otherwise normal coronary anatomy Risk Assessment/Calculations:    CHA2DS2-VASc Score = 4   This indicates a 4.8% annual risk of stroke. The patient's score is based upon: CHF History: 0 HTN History: 1 Diabetes History: 1 Stroke History: 0 Vascular Disease History: 1 Age Score: 1 Gender Score: 0            Physical Exam:   VS:  BP (!) 100/40 (BP Location: Left Arm, Patient Position: Sitting, Cuff Size: Large)   Pulse 72   Ht 5' 10.5 (1.791 m)   Wt 248 lb (112.5 kg)   SpO2 95%   BMI 35.08 kg/m    Wt Readings from Last 3 Encounters:  09/06/24 248 lb (112.5 kg)  05/23/24 249 lb 6.4 oz (113.1 kg)  01/26/24 248 lb (112.5 kg)    GEN: Well nourished, well developed in no acute distress NECK: No JVD; No carotid bruits CARDIAC: RRR, no murmurs, no rubs, no gallops RESPIRATORY:  Clear to auscultation without rales, wheezing or rhonchi  ABDOMEN: Soft, non-tender, non-distended EXTREMITIES:  No edema; No deformity   ASSESSMENT AND PLAN: .    Assessment and Plan Assessment & Plan Atrial  fibrillation paroxysmal Recent episode in May 2025, managed with cardioversion and anticoagulation. Currently asymptomatic with no recurrence. Potential triggers include sleep apnea, obesity, alcohol, and viral infections. Discussed ablation as a future option if episodes recur. Emphasized avoiding ER visits  for AFib unless severe symptoms occur. - Continue apixaban  5 mg twice daily for anticoagulation. - Use carvedilol  as needed for symptomatic episodes. - Contact AFib clinic if symptoms persist beyond 10-12 hours. - Consider ablation if AFib recurs frequently.  Coronary artery disease Nonobstructive coronary artery disease with previous cardiac catheterization in 2006 showing 20-30% stenosis in multiple vessels. No recent chest pain or myocardial infarction. Current management includes statin therapy and anticoagulation. - Continue Crestor three times a week. - Continue apixaban  for anticoagulation.  Type 2 diabetes mellitus with morbid obesity, BMI greater than 35 with 2 or more comorbidities Recent A1c of 7.1%. Managed with Jardiance and metformin. Emphasized weight loss and lifestyle modifications to improve glycemic control. - Continue Jardiance 25 mg daily. - Continue metformin as prescribed. - Encouraged weight loss and lifestyle modifications.  Hypertension Managed with irbesartan and amlodipine . Recent blood pressure reading of 100/40, but typically in the 140/70-75 range. Emphasized monitoring blood pressure and maintaining control. - Continue irbesartan 300 mg daily. - Continue amlodipine  5 mg daily. - Monitor blood pressure regularly.  Obstructive sleep apnea Long-standing obstructive sleep apnea managed with CPAP. Discussed as a potential trigger for atrial fibrillation. - Continue CPAP therapy.  Hyperlipidemia Managed with Crestor. Recent LDL of 34 mg/dL, indicating good control. - Continue Crestor three times a week.         Dispo: 1 year  Signed, Oneil Parchment, MD  "
